# Patient Record
Sex: Female | Born: 1945
Health system: Southern US, Community
[De-identification: ages and names within clinical notes are randomized; demographics above are authoritative.]

## PROBLEM LIST (undated history)

## (undated) DIAGNOSIS — D123 Benign neoplasm of transverse colon: Secondary | ICD-10-CM

## (undated) DIAGNOSIS — M25569 Pain in unspecified knee: Secondary | ICD-10-CM

## (undated) DIAGNOSIS — E785 Hyperlipidemia, unspecified: Secondary | ICD-10-CM

## (undated) DIAGNOSIS — R0600 Dyspnea, unspecified: Secondary | ICD-10-CM

## (undated) DIAGNOSIS — D122 Benign neoplasm of ascending colon: Secondary | ICD-10-CM

## (undated) DIAGNOSIS — Z860100 Personal history of colon polyps, unspecified: Secondary | ICD-10-CM

## (undated) DIAGNOSIS — R5383 Other fatigue: Secondary | ICD-10-CM

## (undated) DIAGNOSIS — Z9889 Other specified postprocedural states: Secondary | ICD-10-CM

## (undated) DIAGNOSIS — E669 Obesity, unspecified: Secondary | ICD-10-CM

## (undated) DIAGNOSIS — D124 Benign neoplasm of descending colon: Secondary | ICD-10-CM

## (undated) DIAGNOSIS — T753XXA Motion sickness, initial encounter: Secondary | ICD-10-CM

## (undated) DIAGNOSIS — I10 Essential (primary) hypertension: Secondary | ICD-10-CM

## (undated) DIAGNOSIS — R112 Nausea with vomiting, unspecified: Secondary | ICD-10-CM

## (undated) HISTORY — DX: Benign neoplasm of descending colon: D12.4

## (undated) HISTORY — DX: Benign neoplasm of ascending colon: D12.2

## (undated) HISTORY — DX: Hyperlipidemia, unspecified: E78.5

## (undated) HISTORY — PX: COLONOSCOPY: SHX174

## (undated) HISTORY — DX: Personal history of colonic polyps: Z86.010

## (undated) HISTORY — DX: Obesity, unspecified: E66.9

## (undated) HISTORY — DX: Benign neoplasm of transverse colon: D12.3

## (undated) HISTORY — DX: Personal history of colon polyps, unspecified: Z86.0100

## (undated) HISTORY — DX: Pain in unspecified knee: M25.569

## (undated) HISTORY — DX: Essential (primary) hypertension: I10

---

## 1976-04-18 HISTORY — PX: ABDOMINAL HYSTERECTOMY: SHX81

## 2003-11-11 ENCOUNTER — Other Ambulatory Visit: Payer: Self-pay

## 2005-07-21 ENCOUNTER — Ambulatory Visit: Payer: Self-pay | Admitting: Unknown Physician Specialty

## 2006-04-18 DIAGNOSIS — Z86010 Personal history of colonic polyps: Secondary | ICD-10-CM

## 2006-04-18 DIAGNOSIS — Z860101 Personal history of adenomatous and serrated colon polyps: Secondary | ICD-10-CM

## 2006-04-18 HISTORY — DX: Personal history of adenomatous and serrated colon polyps: Z86.0101

## 2006-04-18 HISTORY — DX: Personal history of colonic polyps: Z86.010

## 2006-08-14 ENCOUNTER — Ambulatory Visit: Payer: Self-pay | Admitting: Unknown Physician Specialty

## 2006-10-06 ENCOUNTER — Ambulatory Visit: Payer: Self-pay | Admitting: General Surgery

## 2007-04-19 LAB — HM PAP SMEAR

## 2007-08-15 ENCOUNTER — Ambulatory Visit: Payer: Self-pay | Admitting: Unknown Physician Specialty

## 2008-08-29 ENCOUNTER — Ambulatory Visit: Payer: Self-pay | Admitting: Internal Medicine

## 2008-09-01 ENCOUNTER — Ambulatory Visit: Payer: Self-pay | Admitting: Unknown Physician Specialty

## 2009-11-13 ENCOUNTER — Ambulatory Visit: Payer: Self-pay | Admitting: Unknown Physician Specialty

## 2010-11-01 ENCOUNTER — Ambulatory Visit: Payer: Self-pay | Admitting: Internal Medicine

## 2010-11-17 HISTORY — PX: REPLACEMENT TOTAL KNEE: SUR1224

## 2010-11-25 ENCOUNTER — Ambulatory Visit: Payer: Self-pay | Admitting: Unknown Physician Specialty

## 2010-11-29 ENCOUNTER — Ambulatory Visit: Payer: Self-pay | Admitting: General Practice

## 2010-12-15 ENCOUNTER — Inpatient Hospital Stay: Payer: Self-pay | Admitting: General Practice

## 2011-03-14 ENCOUNTER — Other Ambulatory Visit: Payer: Self-pay | Admitting: Internal Medicine

## 2011-03-14 NOTE — Telephone Encounter (Signed)
WORK [phone] PT CALLED SHE HAS QUESTION ABOUT KNEE SURGERY.  SHE WENT TO DENTIST TODAY AND THEY WOULD NOT SEE BECAUSE IT HAS NOT BEEN 6 MONTH SINCE KNEE REPLACEMENT IN AUG.  SHE HAS ANTIBOTIC TO TAKE PRIOR TO APPOINTMENT  THEY TOLD HER TO KEEP ANTIBOTICS ON HAND INCASE SHE GOT CUT  THE INFECTION WOULD GO STRAIGHT TO KNEE.  DOES SHE NEED TO KEEP ANTIBOTICS ON HAND SHE GOING TO CALL PHARMACY TO GET HER BP MEDS REFILLED  LOSARTAN 100MG   1 DAILY.  SHE WOULD LIKE A 90 DAY SUPPLY  [facility] IN [place] SHE HAS APPOINTMENT 04/14/11

## 2011-03-14 NOTE — Telephone Encounter (Signed)
Losartan with 6 refills authorized

## 2011-03-15 MED ORDER — LOSARTAN POTASSIUM 100 MG PO TABS: 100.0000 mg | ORAL_TABLET | Freq: Every day | ORAL | Status: DC

## 2011-03-15 NOTE — Telephone Encounter (Signed)
Patient says that her dentist told her that since having her knee surgery she was told to keep an antibiotic on hand at all times. She is asking if she should. If so can she get something called in. Also wants refill on her losartan.

## 2011-03-15 NOTE — Telephone Encounter (Signed)
That depneds on her orthopedist.  Some of them are doing that. That really is the role of her orthopedist to supply her the prescription, but if she has had trouble reaching him we can do it .  Amoxicillin  500 mg 4 tablets on hour prior to procedure.  #4   1 refill.   the e losartan should have been called in by [place] already.

## 2011-03-16 MED ORDER — LOSARTAN POTASSIUM 100 MG PO TABS: 100.0000 mg | ORAL_TABLET | Freq: Every day | ORAL | Status: DC

## 2011-03-16 NOTE — Telephone Encounter (Signed)
Rx for losartan has been called in, I have left a message asking patient to return my call about the antibiotic.

## 2011-03-17 NOTE — Telephone Encounter (Signed)
This whole phone note was unnecessary.   Patient stated the dentist gave her an antibiotic that day, she just called and talked to Zella Ball to make an appt to discuss everything with Dr. Darrick Huntsman since it had been about 6 months since she had a follow up.  Patient has an appt.

## 2011-04-05 ENCOUNTER — Ambulatory Visit: Payer: Self-pay | Admitting: Internal Medicine

## 2011-04-13 ENCOUNTER — Encounter: Payer: Self-pay | Admitting: Internal Medicine

## 2011-04-14 ENCOUNTER — Encounter: Payer: Self-pay | Admitting: Internal Medicine

## 2011-04-14 ENCOUNTER — Ambulatory Visit (INDEPENDENT_AMBULATORY_CARE_PROVIDER_SITE_OTHER): Payer: BC Managed Care – PPO | Admitting: Internal Medicine

## 2011-04-14 DIAGNOSIS — F419 Anxiety disorder, unspecified: Secondary | ICD-10-CM

## 2011-04-14 DIAGNOSIS — R05 Cough: Secondary | ICD-10-CM

## 2011-04-14 DIAGNOSIS — E785 Hyperlipidemia, unspecified: Secondary | ICD-10-CM

## 2011-04-14 DIAGNOSIS — M545 Low back pain, unspecified: Secondary | ICD-10-CM

## 2011-04-14 DIAGNOSIS — R059 Cough, unspecified: Secondary | ICD-10-CM

## 2011-04-14 DIAGNOSIS — M549 Dorsalgia, unspecified: Secondary | ICD-10-CM

## 2011-04-14 DIAGNOSIS — E663 Overweight: Secondary | ICD-10-CM | POA: Insufficient documentation

## 2011-04-14 DIAGNOSIS — F411 Generalized anxiety disorder: Secondary | ICD-10-CM

## 2011-04-14 DIAGNOSIS — I10 Essential (primary) hypertension: Secondary | ICD-10-CM | POA: Insufficient documentation

## 2011-04-14 DIAGNOSIS — E669 Obesity, unspecified: Secondary | ICD-10-CM

## 2011-04-14 LAB — COMPREHENSIVE METABOLIC PANEL
ALT: 23 U/L (ref 0–35)
Alkaline Phosphatase: 67 U/L (ref 39–117)
Glucose, Bld: 89 mg/dL (ref 70–99)
Sodium: 141 mEq/L (ref 135–145)
Total Bilirubin: 0.5 mg/dL (ref 0.3–1.2)
Total Protein: 6.5 g/dL (ref 6.0–8.3)

## 2011-04-14 LAB — LIPID PANEL
HDL: 43.6 mg/dL (ref >39.00)
Total CHOL/HDL Ratio: 6
Triglycerides: 378 mg/dL — ABNORMAL HIGH (ref 0.0–149.0)

## 2011-04-14 LAB — TSH: TSH: 1.54 u[IU]/mL (ref 0.35–5.50)

## 2011-04-14 MED ORDER — DOXYCYCLINE HYCLATE 100 MG PO TABS: 100.0000 mg | ORAL_TABLET | Freq: Two times a day (BID) | ORAL | Status: AC

## 2011-04-14 MED ORDER — PAROXETINE HCL 10 MG PO TABS: 10.0000 mg | ORAL_TABLET | Freq: Every day | ORAL | Status: DC

## 2011-04-14 MED ORDER — LOSARTAN POTASSIUM 100 MG PO TABS: 100.0000 mg | ORAL_TABLET | Freq: Every day | ORAL | Status: DC

## 2011-04-14 MED ORDER — BENZONATATE 200 MG PO CAPS: 200.0000 mg | ORAL_CAPSULE | Freq: Three times a day (TID) | ORAL | Status: AC | PRN

## 2011-04-14 MED ORDER — TRAMADOL HCL 50 MG PO TABS: 50.0000 mg | ORAL_TABLET | Freq: Four times a day (QID) | ORAL | Status: AC | PRN

## 2011-04-14 MED ORDER — FLUTICASONE PROPIONATE 50 MCG/ACT NA SUSP: 2.0000 | Freq: Every day | NASAL | Status: DC

## 2011-04-14 NOTE — Assessment & Plan Note (Signed)
With prior intolerance to statin therapy  Repeat lipids due.

## 2011-04-14 NOTE — Progress Notes (Signed)
Subjective:    Patient ID: Deeksha Cotrell, female    DOB: 24-Mar-1946, 65 y.o.   MRN: [id number]  HPI  Ms. Dieu is a very pleasant 65 yo white female with a history of obesity, hypertension and hyperlipidemia who presents for  6 month followup.  She has had difficulty sustaining the 60 lbs weight loss that she achieved in 2010/early 2011  by exercising and following a carbohydrate restricted diet.  Her weight gain began in Oct 2011 when her persistent right knee pain prevented her from exercising regularly, and was compliacted by a fall in May 2012 which caused sustained low back pain.  She underwentotal  right knee replacement in  August 29 by Dr. Milinda Antis.  She has good ROM and continuees to do the home exercises advised by PT. She continues to suffer from low back pain which is aggravated by walking and standing more than 30 minutes .  No radiculopathy.Still using daily meloxicam without GI upset, but she has noted elevated blood pressures lately despite compliance with her  medications.   Past Medical History  Diagnosis Date  . Obesity   . Hypertension   . Hyperlipidemia     with statin intolerance  . Knee pain     Current Outpatient Prescriptions on File Prior to Visit  Medication Sig Dispense Refill  . meloxicam (MOBIC) 15 MG tablet Take 15 mg by mouth daily.           Review of Systems  Constitutional: Positive for unexpected weight change. Negative for fever and chills.  HENT: Negative for hearing loss, ear pain, nosebleeds, congestion, sore throat, facial swelling, rhinorrhea, sneezing, mouth sores, trouble swallowing, neck pain, neck stiffness, voice change, postnasal drip, sinus pressure, tinnitus and ear discharge.   Eyes: Negative for pain, discharge, redness and visual disturbance.  Respiratory: Negative for cough, chest tightness, shortness of breath, wheezing and stridor.   Cardiovascular: Negative for chest pain, palpitations and leg swelling.  Musculoskeletal: Positive for  back pain. Negative for myalgias and arthralgias.  Skin: Negative for color change and rash.  Neurological: Negative for dizziness, weakness, light-headedness and headaches.  Hematological: Negative for adenopathy.   BP 136/84  Pulse 72  Temp(Src) 98.3 F (36.8 C) (Oral)  Ht 5' 3.5" (1.613 m)  Wt 213 lb 8 oz (96.843 kg)  BMI 37.23 kg/m2     Objective:   Physical Exam  Vitals reviewed. Constitutional: She is oriented to person, place, and time. She appears well-developed and well-nourished.  HENT:  Mouth/Throat: Oropharynx is clear and moist.  Eyes: EOM are normal. Pupils are equal, round, and reactive to light. No scleral icterus.  Neck: Normal range of motion. Neck supple. No JVD present. No thyromegaly present.  Cardiovascular: Normal rate, regular rhythm, normal heart sounds and intact distal pulses.   Pulmonary/Chest: Effort normal and breath sounds normal.  Abdominal: Soft. Bowel sounds are normal. She exhibits no mass. There is no tenderness.  Musculoskeletal: Normal range of motion. She exhibits no edema.  Lymphadenopathy:    She has no cervical adenopathy.  Neurological: She is alert and oriented to person, place, and time.  Skin: Skin is warm and dry.  Psychiatric: She has a normal mood and affect.      Assessment & Plan:   Obesity (BMI 30-39.9) Given her regain of nearly 40 lbs, will check thyroid function as this has not been done in one year .  She is not dieting or exercising due to low back pain and the holidays.  Encouragement given.  Her personal best was 182 lbs in Oct 2011.   Lumbago without sciatica With no radicular symptoms, there is no indication for MRI.  Recommend that she discuss back strengthening exercises with her chiropractor. Stopping meloxicam and adding tramadol for pain control.   Hyperlipidemia LDL goal < 100 With prior intolerance to statin therapy  Repeat lipids due.     Updated Medication List Outpatient Encounter Prescriptions as of  04/14/2011  Medication Sig Dispense Refill  . fluticasone (FLONASE) 50 MCG/ACT nasal spray Place 2 sprays into the nose daily.  32 g  6  . losartan (COZAAR) 100 MG tablet Take 1 tablet (100 mg total) by mouth daily.  60 tablet  6  . meloxicam (MOBIC) 15 MG tablet Take 15 mg by mouth daily.        Marland Kitchen PARoxetine (PAXIL) 10 MG tablet Take 1 tablet (10 mg total) by mouth daily.  60 tablet  6  . DISCONTD: fluticasone (FLONASE) 50 MCG/ACT nasal spray Place 2 sprays into the nose daily.        Marland Kitchen DISCONTD: losartan (COZAAR) 100 MG tablet Take 1 tablet (100 mg total) by mouth daily.  90 tablet  3  . DISCONTD: PARoxetine (PAXIL) 10 MG tablet Take 10 mg by mouth daily.        . benzonatate (TESSALON) 200 MG capsule Take 1 capsule (200 mg total) by mouth 3 (three) times daily as needed for cough.  30 capsule  1  . doxycycline (VIBRA-TABS) 100 MG tablet Take 1 tablet (100 mg total) by mouth 2 (two) times daily.  14 tablet  0  . traMADol (ULTRAM) 50 MG tablet Take 1 tablet (50 mg total) by mouth every 6 (six) hours as needed for pain. Maximum dose= 8 tablets per day  120 tablet  3

## 2011-04-14 NOTE — Assessment & Plan Note (Signed)
With no radicular symptoms, there is no indication for MRI.  Recommend that she discuss back strengthening exercises with her chiropractor. Stopping meloxicam and adding tramadol for pain control.

## 2011-04-14 NOTE — Assessment & Plan Note (Signed)
Given her regain of nearly 40 lbs, will check thyroid function as this has not been done in one year .  She is not dieting or exercising due to low back pain and the holidays. Encouragement given.  Her personal best was 182 lbs in Oct 2011.

## 2011-04-14 NOTE — Patient Instructions (Addendum)
Suspend meloxicam for 2 weeks to see if your blood pressure improves and to see if the meloxicam is still helping.  Use Tramadol up to 8 daily maximum dose.    I called in doxycucline and a cough tablet for the next 7 days

## 2011-05-02 ENCOUNTER — Telehealth: Payer: Self-pay | Admitting: Internal Medicine

## 2011-05-02 MED ORDER — BENZONATATE 200 MG PO CAPS: 200.0000 mg | ORAL_CAPSULE | Freq: Three times a day (TID) | ORAL | Status: AC | PRN

## 2011-05-02 NOTE — Telephone Encounter (Signed)
Patient notified. Rx sent to pharmacy. Patient will start zyrtec or allegra and call back if no improvement.

## 2011-05-02 NOTE — Telephone Encounter (Signed)
Based on symptoms does not need an antibiotic.  But can refill tessalon  Rx.    Ask her to start zyrtec or allegra to see if an antihistamin will help., if not, make appt

## 2011-05-02 NOTE — Telephone Encounter (Signed)
Pt was seen for URI symptoms on 12/27.  She was given doxycycline which helped but when she finished the medicine her symptoms came back- congestion, clear to yellow nasal drainage, cough.  No fever.  She thinks she needs more antibiotic.  She will start back on the tesselon, but may need a refill because she only has about 14. Uses [facility].

## 2011-05-02 NOTE — Telephone Encounter (Signed)
The medication that was given at her last appointment is not doing any good and the patient would like something else called into the pharmacy. She is still coughing and hacking .

## 2011-09-14 ENCOUNTER — Encounter: Payer: Self-pay | Admitting: Internal Medicine

## 2011-09-14 DIAGNOSIS — Z86010 Personal history of colonic polyps: Secondary | ICD-10-CM | POA: Insufficient documentation

## 2011-09-21 ENCOUNTER — Ambulatory Visit: Payer: Self-pay | Admitting: General Surgery

## 2011-09-22 LAB — PATHOLOGY REPORT

## 2011-10-06 LAB — HM COLONOSCOPY

## 2011-11-15 LAB — HM MAMMOGRAPHY: HM Mammogram: NORMAL

## 2011-11-15 LAB — HM PAP SMEAR: HM Pap smear: NORMAL

## 2011-12-02 ENCOUNTER — Ambulatory Visit: Payer: BC Managed Care – PPO | Admitting: Internal Medicine

## 2011-12-02 ENCOUNTER — Ambulatory Visit: Payer: Self-pay | Admitting: Unknown Physician Specialty

## 2011-12-16 ENCOUNTER — Ambulatory Visit (INDEPENDENT_AMBULATORY_CARE_PROVIDER_SITE_OTHER): Payer: BC Managed Care – PPO | Admitting: Internal Medicine

## 2011-12-16 ENCOUNTER — Encounter: Payer: Self-pay | Admitting: Internal Medicine

## 2011-12-16 VITALS — BP 140/86 | HR 60 | Temp 98.4°F | Resp 16 | Wt 224.2 lb

## 2011-12-16 DIAGNOSIS — R05 Cough: Secondary | ICD-10-CM

## 2011-12-16 DIAGNOSIS — Z124 Encounter for screening for malignant neoplasm of cervix: Secondary | ICD-10-CM

## 2011-12-16 DIAGNOSIS — Z86010 Personal history of colonic polyps: Secondary | ICD-10-CM

## 2011-12-16 DIAGNOSIS — E669 Obesity, unspecified: Secondary | ICD-10-CM

## 2011-12-16 DIAGNOSIS — F411 Generalized anxiety disorder: Secondary | ICD-10-CM

## 2011-12-16 DIAGNOSIS — Z1239 Encounter for other screening for malignant neoplasm of breast: Secondary | ICD-10-CM

## 2011-12-16 DIAGNOSIS — F419 Anxiety disorder, unspecified: Secondary | ICD-10-CM

## 2011-12-16 DIAGNOSIS — I10 Essential (primary) hypertension: Secondary | ICD-10-CM

## 2011-12-16 DIAGNOSIS — E785 Hyperlipidemia, unspecified: Secondary | ICD-10-CM

## 2011-12-16 LAB — COMPREHENSIVE METABOLIC PANEL
AST: 29 U/L (ref 0–37)
Albumin: 3.9 g/dL (ref 3.5–5.2)
Alkaline Phosphatase: 59 U/L (ref 39–117)
Potassium: 4.5 mEq/L (ref 3.5–5.1)
Sodium: 140 mEq/L (ref 135–145)
Total Bilirubin: 0.5 mg/dL (ref 0.3–1.2)
Total Protein: 6.9 g/dL (ref 6.0–8.3)

## 2011-12-16 LAB — LIPID PANEL
Cholesterol: 220 mg/dL — ABNORMAL HIGH (ref 0–200)
Total CHOL/HDL Ratio: 5

## 2011-12-16 LAB — TSH: TSH: 1.72 u[IU]/mL (ref 0.35–5.50)

## 2011-12-16 LAB — LDL CHOLESTEROL, DIRECT: Direct LDL: 101.3 mg/dL

## 2011-12-16 MED ORDER — PAROXETINE HCL 10 MG PO TABS: 10.0000 mg | ORAL_TABLET | Freq: Every day | ORAL | Status: DC

## 2011-12-16 MED ORDER — FLUTICASONE PROPIONATE 50 MCG/ACT NA SUSP: 2.0000 | Freq: Every day | NASAL | Status: DC

## 2011-12-16 MED ORDER — LOSARTAN POTASSIUM 100 MG PO TABS: 100.0000 mg | ORAL_TABLET | Freq: Every day | ORAL | Status: DC

## 2011-12-16 NOTE — Progress Notes (Signed)
Patient ID: Joanne Benton, female   DOB: 24-Apr-1945, 66 y.o.   MRN: [id number]  Patient Active Problem List  Diagnosis  . Obesity (BMI 30-39.9)  . Lumbago without sciatica  . Hypertension  . Hyperlipidemia LDL goal < 100  . Hx of adenomatous colonic polyps    Subjective:  CC:   Chief Complaint  Patient presents with  . Medication Refill    HPI:   Joanne Riceis a 66 y.o. female who presents for follow up on hyperlipidemia, obesity and knee pain   History of right knee pain ,  Both hips hurting.  Wondering if a new fish oil she has heard about would help.  She has had a prior trial of osteo bi flex with no significant improvement in her pain. She is also been voiding meloxican for the past 3 weeks due to some mild gastritis symptoms. She has not been following a low glycemic index diet in several months and has gained all her weight back. She is currently in an apathetic state state about her weight.   Past Medical History  Diagnosis Date  . Obesity   . Hypertension   . Hyperlipidemia     with statin intolerance  . Knee pain   . Hx of adenomatous colonic polyps 2008    Sankar, repeat due 2013    Past Surgical History  Procedure Date  . Replacement total knee August 2012    Right , [place]         The following portions of the patient's history were reviewed and updated as appropriate: Allergies, current medications, and problem list.    Review of Systems:   12 Pt  review of systems was negative except those addressed in the HPI,     History   Social History  . Marital Status: Married    Spouse Name: N/A    Number of Children: N/A  . Years of Education: N/A   Occupational History  . Not on file.   Social History Main Topics  . Smoking status: Never Smoker   . Smokeless tobacco: Never Used  . Alcohol Use: No  . Drug Use: No  . Sexually Active: Not on file   Other Topics Concern  . Not on file   Social History Narrative   Married.     Objective:  BP 140/86  Pulse 60  Temp 98.4 F (36.9 C) (Oral)  Resp 16  Wt 224 lb 4 oz (101.719 kg)  SpO2 97%  General appearance: alert, cooperative and appears stated age Ears: normal TM's and external ear canals both ears Throat: lips, mucosa, and tongue normal; teeth and gums normal Neck: no adenopathy, no carotid bruit, supple, symmetrical, trachea midline and thyroid not enlarged, symmetric, no tenderness/mass/nodules Back: symmetric, no curvature. ROM normal. No CVA tenderness. Lungs: clear to auscultation bilaterally Heart: regular rate and rhythm, S1, S2 normal, no murmur, click, rub or gallop Abdomen: soft, non-tender; bowel sounds normal; no masses,  no organomegaly Pulses: 2+ and symmetric Skin: Skin color, texture, turgor normal. No rashes or lesions Lymph nodes: Cervical, supraclavicular, and axillary nodes normal.  Assessment and Plan:  Hx of adenomatous colonic polyps She  Her scheduled colonoscopy by Dr. Doristine Counter for history of adenomatous polyps. An adenomatous polyp was again found in the sigmoid colon. Repeat Colonoscopy is due in 2018.  Hyperlipidemia LDL goal < 100 Is triglycerides 400. Recommend trial of TriCor in a low glycemic index diet which she did very well and in previous years to  manage her obesity  Obesity (BMI 30-39.9) BMI is 39. We addressed her weight today is a significant contributor joint pain. She has had prior success with a low glycemic index diet and has concurrent hypertriglyceridemia. She was given a printout an example diet.  Hypertension Well controlled previously on losartan alone. She has discontinued use of meloxicam to see if this is a major difference. It has not. Will give her a couple of months with weight loss see for her diastolic come down. Otherwise we'll add hydrochlorothiazide to her Lorcet losartan.   Updated Medication List Outpatient Encounter Prescriptions as of 12/16/2011  Medication Sig Dispense Refill  .  fluticasone (FLONASE) 50 MCG/ACT nasal spray Place 2 sprays into the nose daily.  32 g  6  . losartan (COZAAR) 100 MG tablet Take 1 tablet (100 mg total) by mouth daily.  60 tablet  6  . PARoxetine (PAXIL) 10 MG tablet Take 1 tablet (10 mg total) by mouth daily.  60 tablet  6  . DISCONTD: fluticasone (FLONASE) 50 MCG/ACT nasal spray Place 2 sprays into the nose daily.  32 g  6  . DISCONTD: losartan (COZAAR) 100 MG tablet Take 1 tablet (100 mg total) by mouth daily.  60 tablet  6  . DISCONTD: PARoxetine (PAXIL) 10 MG tablet Take 1 tablet (10 mg total) by mouth daily.  60 tablet  6  . DISCONTD: meloxicam (MOBIC) 15 MG tablet Take 15 mg by mouth daily.

## 2011-12-16 NOTE — Patient Instructions (Addendum)
We  Don't care about H Pylori unless you develop problems with your stomach  Ok to combine tylenol, advil, and tramadol as needed   This is  My updated version of a  "Low GI"  Diet:  All of the foods can be found at grocery stores and in bulk at [employer].  The Atkins protein bars and shakes are available in more varieties at [employer], [employer] and [employer].     7 AM Breakfast:  Low carbohydrate Protein  Shakes (I recommend the EAS AdvantEdge "Carb Control" shakes  Or the low carb shakes by Atkins.   Both are available everywhere:  In  cases at [employer]Js  Or in 4 packs at grocery stores and pharmacies  2.5 carbs  (Alternative is  a toasted Arnold's Sandwhich Thin w/ peanut butter, a "Bagel Thin" with cream cheese and salmon) or  a scrambled egg burrito made with a low carb tortilla .  Avoid cereal and bananas, oatmeal too unless you are cooking the old fashioned kind that takes 30-40 minutes to prepare.  the rest is overly processed, has minimal fiber, and is loaded with carbohydrates!   10 AM: Protein bar by Atkins (the snack size, under 200 cal).  There are many varieties , available widely again or in bulk in limited varieties at [employer]Js)  Other so called "protein bars" tend to be loaded with carbohydrates.  Remember, in food advertising, the word "energy" is synonymous for " carbohydrate."  Lunch: sandwich of [place], (or any lunchmeat, grilled meat or canned tuna), fresh avocado, mayonnaise  and cheese on a lower carbohydrate pita bread, flatbread, or tortilla . Ok to use regular mayonnaise. The bread is the only source or carbohydrate that can be decreased (Joseph's makes a pita bread and a flat bread that are 50 cal and 4 net carbs ; Toufayan makes a low carb flatbread that's 100 cal and 9 net carbs  and  Mission makes a low carb whole wheat tortilla  That is 210 cal and 6 net carbs)  3 PM:  Mid day :  Another protein bar,  Or a  cheese stick (100 cal, 0 carbs),  Or 1 ounce of  almonds, walnuts,  pistachios, pecans, peanuts,  Macadamia nuts. Or a Dannon light n Fit greek yogurt, 80 cal 8 net carbs . Avoid "granola"; the dried cranberries and raisins are loaded with carbohydrates. Mixed nuts ok if no raisins or cranberries or dried fruit.      6 PM  Dinner:  "mean and green:"  Meat/chicken/fish or a high protein legume; , with a green salad, and a low GI  Veggie (broccoli, cauliflower, green beans, spinach, brussel sprouts. Lima beans) : Avoid "Low fat dressings, as well as Reyne Dumas and [address]! They are loaded with sugar! Instead use ranch, vinagrette,  Blue cheese, etc  9 PM snack : Breyer's "low carb" fudgsicle or  ice cream bar (Carb Smart line), or  Weight Watcher's ice cream bar , or another "no sugar added" ice cream;a serving of fresh berries/cherries with whipped cream (Avoid bananas, pineapple, grapes  and watermelon on a regular basis because they are high in sugar)   Remember that snack Substitutions should be less than 15 to 20 carbs  Per serving. Remember to subtract fiber grams and sugar alcohols to get the "net carbs."

## 2011-12-17 ENCOUNTER — Encounter: Payer: Self-pay | Admitting: Internal Medicine

## 2011-12-17 ENCOUNTER — Telehealth: Payer: Self-pay | Admitting: Internal Medicine

## 2011-12-17 MED ORDER — FENOFIBRATE 145 MG PO TABS: 145.0000 mg | ORAL_TABLET | Freq: Every day | ORAL | Status: DC

## 2011-12-17 NOTE — Assessment & Plan Note (Signed)
Is triglycerides 400. Recommend trial of TriCor in a low glycemic index diet which she did very well and in previous years to manage her obesity

## 2011-12-17 NOTE — Telephone Encounter (Signed)
Her cholesterol was pretty high without medication. Her triglycerides were over 400. I recommended she start TriCor 1 tablet daily to get her triglycerides down. The low glycemic index will help as well but I would like to start it least with TriCor until she is motivated to do the diet. She will need repeat fasting lipids and a cemented in 3 months. I have sent a prescription to her pharmacy.

## 2011-12-17 NOTE — Assessment & Plan Note (Signed)
BMI is 39. We addressed her weight today is a significant contributor joint pain. She has had prior success with a low glycemic index diet and has concurrent hypertriglyceridemia. She was given a printout an example diet.

## 2011-12-17 NOTE — Assessment & Plan Note (Addendum)
She  Her scheduled colonoscopy by Dr. Doristine Counter for history of adenomatous polyps. An adenomatous polyp was again found in the sigmoid colon. Repeat Colonoscopy is due in 2018.

## 2011-12-17 NOTE — Assessment & Plan Note (Signed)
Well controlled previously on losartan alone. She has discontinued use of meloxicam to see if this is a major difference. It has not. Will give her a couple of months with weight loss see for her diastolic come down. Otherwise we'll add hydrochlorothiazide to her Lorcet losartan.

## 2011-12-20 NOTE — Telephone Encounter (Signed)
Patient notified

## 2011-12-20 NOTE — Telephone Encounter (Signed)
Left message asking patient to call.

## 2011-12-20 NOTE — Telephone Encounter (Signed)
Copy of low glycemic index diet mailed to patient, also she will call back to schedule lab appt

## 2012-03-26 ENCOUNTER — Ambulatory Visit (INDEPENDENT_AMBULATORY_CARE_PROVIDER_SITE_OTHER): Payer: BC Managed Care – PPO | Admitting: Internal Medicine

## 2012-03-26 ENCOUNTER — Encounter: Payer: Self-pay | Admitting: Internal Medicine

## 2012-03-26 ENCOUNTER — Ambulatory Visit: Payer: BC Managed Care – PPO

## 2012-03-26 VITALS — BP 124/82 | HR 50 | Temp 97.4°F | Resp 12 | Ht 63.0 in | Wt 202.2 lb

## 2012-03-26 DIAGNOSIS — E785 Hyperlipidemia, unspecified: Secondary | ICD-10-CM

## 2012-03-26 DIAGNOSIS — I498 Other specified cardiac arrhythmias: Secondary | ICD-10-CM

## 2012-03-26 DIAGNOSIS — I10 Essential (primary) hypertension: Secondary | ICD-10-CM

## 2012-03-26 DIAGNOSIS — E669 Obesity, unspecified: Secondary | ICD-10-CM

## 2012-03-26 DIAGNOSIS — K5900 Constipation, unspecified: Secondary | ICD-10-CM

## 2012-03-26 DIAGNOSIS — Z1331 Encounter for screening for depression: Secondary | ICD-10-CM

## 2012-03-26 DIAGNOSIS — R001 Bradycardia, unspecified: Secondary | ICD-10-CM

## 2012-03-26 LAB — LDL CHOLESTEROL, DIRECT: Direct LDL: 142.9 mg/dL

## 2012-03-26 LAB — COMPREHENSIVE METABOLIC PANEL
AST: 21 U/L (ref 0–37)
Alkaline Phosphatase: 60 U/L (ref 39–117)
BUN: 13 mg/dL (ref 6–23)
Creatinine, Ser: 0.8 mg/dL (ref 0.4–1.2)
Total Bilirubin: 0.6 mg/dL (ref 0.3–1.2)

## 2012-03-26 LAB — LIPID PANEL
Cholesterol: 235 mg/dL — ABNORMAL HIGH (ref 0–200)
HDL: 36.1 mg/dL — ABNORMAL LOW (ref >39.00)
Triglycerides: 323 mg/dL — ABNORMAL HIGH (ref 0.0–149.0)
VLDL: 64.6 mg/dL — ABNORMAL HIGH (ref 0.0–40.0)

## 2012-03-26 NOTE — Assessment & Plan Note (Signed)
LDL remains elevated at 142 but triglycerides dropped from 455 to 320.  Will check again in 3 months

## 2012-03-26 NOTE — Progress Notes (Signed)
Patient ID: Joanne Benton, female   DOB: June 23, 1945, 66 y.o.   MRN: [id number]  Patient Active Problem List  Diagnosis  . Obesity (BMI 30-39.9)  . Lumbago without sciatica  . Hypertension  . Hyperlipidemia LDL goal < 100  . Hx of adenomatous colonic polyps  . Bradycardia    Subjective:  CC:   Chief Complaint  Patient presents with  . Follow-up    HPI:   Joanne Riceis a 66 y.o. female who presents for follow up on hyperlipidemia , hypertension, and obesity.  She feels great.  She never started the fenofibrate, but decided to address it with weight loss,  She has been exercising with a Systems analyst. She has lost 22 lbs thus far,  But is constipated despite using fiber supplement daily. Following a low gi diet. No aches or pains.    Past Medical History  Diagnosis Date  . Obesity   . Hypertension   . Hyperlipidemia     with statin intolerance  . Knee pain   . Hx of adenomatous colonic polyps 2008    Sankar, repeat due 2013    Past Surgical History  Procedure Date  . Replacement total knee August 2012    Right , [place]         The following portions of the patient's history were reviewed and updated as appropriate: Allergies, current medications, and problem list.    Review of Systems:   Patient denies headache, fevers, malaise, unintentional weight loss, skin rash, eye pain, sinus congestion and sinus pain, sore throat, dysphagia,  hemoptysis , cough, dyspnea, wheezing, chest pain, palpitations, orthopnea, edema, abdominal pain, nausea, melena, diarrhea, constipation, flank pain, dysuria, hematuria, urinary  Frequency, nocturia, numbness, tingling, seizures,  Focal weakness, Loss of consciousness,  Tremor, insomnia, depression, anxiety, and suicidal ideation.         History   Social History  . Marital Status: Married    Spouse Name: N/A    Number of Children: N/A  . Years of Education: N/A   Occupational History  . Not on file.   Social History Main  Topics  . Smoking status: Never Smoker   . Smokeless tobacco: Never Used  . Alcohol Use: No  . Drug Use: No  . Sexually Active: Not on file   Other Topics Concern  . Not on file   Social History Narrative   Married.    Objective:  BP 124/82  Pulse 50  Temp 97.4 F (36.3 C) (Oral)  Resp 12  Ht 5\' 3"  (1.6 m)  Wt 202 lb 4 oz (91.74 kg)  BMI 35.83 kg/m2  SpO2 95%  General appearance: alert, cooperative and appears stated age Ears: normal TM's and external ear canals both ears Throat: lips, mucosa, and tongue normal; teeth and gums normal Neck: no adenopathy, no carotid bruit, supple, symmetrical, trachea midline and thyroid not enlarged, symmetric, no tenderness/mass/nodules Back: symmetric, no curvature. ROM normal. No CVA tenderness. Lungs: clear to auscultation bilaterally Heart: regular rate and rhythm, S1, S2 normal, no murmur, click, rub or gallop Abdomen: soft, non-tender; bowel sounds normal; no masses,  no organomegaly Pulses: 2+ and symmetric Skin: Skin color, texture, turgor normal. No rashes or lesions Lymph nodes: Cervical, supraclavicular, and axillary nodes normal.  Assessment and Plan:  Bradycardia Asymptomatic, whi no history of syncope.  An ekg was done today, QT interval was borderline.  Checking electrolytes.   Hypertension Now well controlled with weight loss and suspension of meloxicam.  No changes today  Hyperlipidemia LDL goal < 100 LDL remains elevated at 142 but triglycerides dropped from 455 to 320.  Will check again in 3 months  Obesity (BMI 30-39.9) Improving BMI with regular exercise program and low GI diet.    Updated Medication List Outpatient Encounter Prescriptions as of 03/26/2012  Medication Sig Dispense Refill  . fluticasone (FLONASE) 50 MCG/ACT nasal spray Place 2 sprays into the nose daily.  32 g  6  . losartan (COZAAR) 100 MG tablet Take 1 tablet (100 mg total) by mouth daily.  60 tablet  6  . PARoxetine (PAXIL) 10 MG  tablet Take 1 tablet (10 mg total) by mouth daily.  60 tablet  6  . [DISCONTINUED] fenofibrate (TRICOR) 145 MG tablet Take 1 tablet (145 mg total) by mouth daily.  30 tablet  5     Orders Placed This Encounter  Procedures  . Lipid panel  . Comprehensive metabolic panel  . Magnesium  . LDL cholesterol, direct  . EKG 12-Lead    Return in about 3 months (around 06/24/2012).

## 2012-03-26 NOTE — Assessment & Plan Note (Addendum)
Now well controlled with weight loss and suspension of meloxicam.  No changes today

## 2012-03-26 NOTE — Assessment & Plan Note (Addendum)
Asymptomatic, whi no history of syncope.  An ekg was done today, QT interval was borderline.  Checking electrolytes.

## 2012-03-26 NOTE — Assessment & Plan Note (Signed)
Improving BMI with regular exercise program and low GI diet.

## 2012-03-26 NOTE — Patient Instructions (Addendum)
Compare the fiber content of the Atkins bars and your [employer] protein bars.  Also try the Mission low carb tortillas ,  They have 26 g of fiber   Also look for Dreamfield's  Pasta.  It is low carb and does not get digested so it works like fiber pill.   You need a minimum of  35 grams of fiber daily  You can take Colace (docusate) stool softener up to twice daily  100 mg   Try the chocolate covered almonds (in moderation) as a chocolate fix

## 2012-03-27 ENCOUNTER — Telehealth: Payer: Self-pay | Admitting: Internal Medicine

## 2012-03-27 NOTE — Telephone Encounter (Signed)
Patient wanting a call on her EKG result from yesterday.

## 2012-03-27 NOTE — Addendum Note (Signed)
Addended by: Sherlene Shams on: 03/27/2012 12:55 PM   Modules accepted: Orders

## 2012-03-27 NOTE — Telephone Encounter (Signed)
Sinus bradycardia (normal,.  Just slow) as long as she is not fainting,  It is not considered abnormal.

## 2012-03-28 NOTE — Telephone Encounter (Signed)
Spoke to patient via phone, gave her EKG RESULTS.

## 2012-06-25 ENCOUNTER — Ambulatory Visit (INDEPENDENT_AMBULATORY_CARE_PROVIDER_SITE_OTHER): Payer: BC Managed Care – PPO | Admitting: Internal Medicine

## 2012-06-25 ENCOUNTER — Encounter: Payer: Self-pay | Admitting: Internal Medicine

## 2012-06-25 VITALS — BP 128/88 | HR 48 | Temp 97.4°F | Resp 16 | Wt 196.5 lb

## 2012-06-25 DIAGNOSIS — I10 Essential (primary) hypertension: Secondary | ICD-10-CM

## 2012-06-25 DIAGNOSIS — L659 Nonscarring hair loss, unspecified: Secondary | ICD-10-CM

## 2012-06-25 DIAGNOSIS — E669 Obesity, unspecified: Secondary | ICD-10-CM

## 2012-06-25 DIAGNOSIS — I498 Other specified cardiac arrhythmias: Secondary | ICD-10-CM

## 2012-06-25 DIAGNOSIS — R001 Bradycardia, unspecified: Secondary | ICD-10-CM

## 2012-06-25 DIAGNOSIS — E785 Hyperlipidemia, unspecified: Secondary | ICD-10-CM

## 2012-06-25 DIAGNOSIS — L639 Alopecia areata, unspecified: Secondary | ICD-10-CM | POA: Insufficient documentation

## 2012-06-25 LAB — COMPREHENSIVE METABOLIC PANEL
ALT: 20 U/L (ref 0–35)
AST: 20 U/L (ref 0–37)
CO2: 27 mEq/L (ref 19–32)
Calcium: 9.6 mg/dL (ref 8.4–10.5)
Chloride: 107 mEq/L (ref 96–112)
GFR: 66.91 mL/min (ref >60.00)
Sodium: 141 mEq/L (ref 135–145)
Total Protein: 6.8 g/dL (ref 6.0–8.3)

## 2012-06-25 LAB — LIPID PANEL
Cholesterol: 250 mg/dL — ABNORMAL HIGH (ref 0–200)
HDL: 36 mg/dL — ABNORMAL LOW (ref >39.00)

## 2012-06-25 LAB — TSH: TSH: 1.85 u[IU]/mL (ref 0.35–5.50)

## 2012-06-25 NOTE — Assessment & Plan Note (Addendum)
Well controlled on current regimen. Renal function stable, no changes today. 

## 2012-06-25 NOTE — Assessment & Plan Note (Addendum)
Etiology unclear.  thyroid function screen was normal, will refer to dermatology if hair loss continues.

## 2012-06-25 NOTE — Assessment & Plan Note (Addendum)
Triglycerides have imporcen  byt her LDL has not yet improved  with wt loss.  Recommended low GI index diet and repeat in 3 months.

## 2012-06-25 NOTE — Assessment & Plan Note (Signed)
Improving with 28 lb wt loss thus far. Achieved with exercise and dietary modification

## 2012-06-25 NOTE — Assessment & Plan Note (Signed)
EKG was repeated.  Sinus bradycardia noted.  She is asymptomatic.

## 2012-06-25 NOTE — Progress Notes (Signed)
Patient ID: Joanne Benton, female   DOB: May 28, 1945, 67 y.o.   MRN: [id number]  Patient Active Problem List  Diagnosis  . Obesity (BMI 30-39.9)  . Lumbago without sciatica  . Hypertension  . Hyperlipidemia LDL goal < 100  . Hx of adenomatous colonic polyps  . Bradycardia    Subjective:  CC:   Chief Complaint  Patient presents with  . Follow-up    3 month    HPI:   Joanne Riceis a 67 y.o. female who presents Follow up on obesity and OA.  She has lost 28 lbs so far with dietary modification and exercise.  She is using the elliptical and a 3 wheeled bicycle for exercise.  She rode 5 miles yesterday.  Her osteoarthritis is aggravating her left knee but her knee pain is improved with exercise and aggravated by rest.   Cc: new onset hair loss noticed by hair dressor. No female pattern.   Some  emotional stressors ,  Husband had complicated hernia repair in January in  And she has been driving back and forth to [place].   Past Medical History  Diagnosis Date  . Obesity   . Hypertension   . Hyperlipidemia     with statin intolerance  . Knee pain   . Hx of adenomatous colonic polyps 2008    Sankar, repeat due 2013    Past Surgical History  Procedure Laterality Date  . Replacement total knee  August 2012    Right , [facility]       The following portions of the patient's history were reviewed and updated as appropriate: Allergies, current medications, and problem list.    Review of Systems:   Patient denies headache, fevers, malaise, unintentional weight loss, skin rash, eye pain, sinus congestion and sinus pain, sore throat, dysphagia,  hemoptysis , cough, dyspnea, wheezing, chest pain, palpitations, orthopnea, edema, abdominal pain, nausea, melena, diarrhea, constipation, flank pain, dysuria, hematuria, urinary  Frequency, nocturia, numbness, tingling, seizures,  Focal weakness, Loss of consciousness,  Tremor, insomnia, depression, anxiety, and suicidal ideation.     History    Social History  . Marital Status: Married    Spouse Name: N/A    Number of Children: N/A  . Years of Education: N/A   Occupational History  . Not on file.   Social History Main Topics  . Smoking status: Never Smoker   . Smokeless tobacco: Never Used  . Alcohol Use: No  . Drug Use: No  . Sexually Active: Not on file   Other Topics Concern  . Not on file   Social History Narrative   Married.    Objective:  BP 128/88  Pulse 48  Temp(Src) 97.4 F (36.3 C) (Oral)  Resp 16  Wt 196 lb 8 oz (89.132 kg)  BMI 34.82 kg/m2  SpO2 96%  General appearance: alert, cooperative and appears stated age Scalp: diffuse thinning  Ears: normal TM's and external ear canals both ears Throat: lips, mucosa, and tongue normal; teeth and gums normal Neck: no adenopathy, no carotid bruit, supple, symmetrical, trachea midline and thyroid not enlarged, symmetric, no tenderness/mass/nodules Back: symmetric, no curvature. ROM normal. No CVA tenderness. Lungs: clear to auscultation bilaterally Heart: regular rate and rhythm, S1, S2 normal, no murmur, click, rub or gallop Abdomen: soft, non-tender; bowel sounds normal; no masses,  no organomegaly Pulses: 2+ and symmetric Skin: Skin color, texture, turgor normal. No rashes or lesions Lymph nodes: Cervical, supraclavicular, and axillary nodes normal.  Assessment and Plan:  Obesity (  BMI 30-39.9) Improving with 28 lb wt loss thus far. Achieved with exercise and dietary modification  Hypertension Well controlled on current regimen. Renal function stable,  no changes today.  Hyperlipidemia LDL goal < 100 Triglycerides have imporcen  byt her LDL has not yet improved  with wt loss.  Recommended low GI index diet and repeat in 3 months.   Bradycardia EKG was repeated.  Sinus bradycardia noted.  She is asymptomatic.   Alopecia areata Etiology unclear.  thyroid function screen was normal, will refer to dermatology if hair loss continues.     Updated Medication List Outpatient Encounter Prescriptions as of 06/25/2012  Medication Sig Dispense Refill  . fluticasone (FLONASE) 50 MCG/ACT nasal spray Place 2 sprays into the nose daily.  32 g  6  . losartan (COZAAR) 100 MG tablet Take 1 tablet (100 mg total) by mouth daily.  60 tablet  6  . PARoxetine (PAXIL) 10 MG tablet Take 1 tablet (10 mg total) by mouth daily.  60 tablet  6   No facility-administered encounter medications on file as of 06/25/2012.

## 2012-06-25 NOTE — Patient Instructions (Addendum)
 This is  my version of a  "Low GI"  Diet:  It is not ultra low carb, but will still lower your blood sugars and allow you to lose 5 to 10 lbs per month if you follow it carefully. All of the foods can be found at grocery stores and in bulk at [employer].  The Atkins protein bars and shakes are available in more varieties at [employer], [employer] and [employer].     7 AM Breakfast:  Low carbohydrate Protein  Shakes (I recommend the EAS AdvantEdge "Carb Control" shakes  Or the low carb shakes by Atkins.   Both are available everywhere:  In  cases at [employer]Js  Or in 4 packs at grocery stores and pharmacies  2.5 carbs  (Alternative is  a toasted Arnold's Sandwhich Thin w/ peanut butter, a "Bagel Thin" with cream cheese and salmon) or  a scrambled egg burrito made with a low carb tortilla .  Avoid cereal and bananas, oatmeal too unless you are cooking the old fashioned kind that takes 30-40 minutes to prepare.  the rest is overly processed, has minimal fiber, and is loaded with carbohydrates!   10 AM: Protein bar by Atkins (the snack size, under 200 cal).  There are many varieties , available widely again or in bulk in limited varieties at BJs)  Other so called "protein bars" tend to be loaded with carbohydrates.  Remember, in food advertising, the word "energy" is synonymous for " carbohydrate."  Lunch: sandwich of turkey, (or any lunchmeat, grilled meat or canned tuna), fresh avocado, mayonnaise  and cheese on a lower carbohydrate pita bread, flatbread, or tortilla . Ok to use regular mayonnaise. The bread is the only source or carbohydrate that can be decreased (Joseph's makes a pita bread and a flat bread that are 50 cal and 4 net carbs ; Toufayan makes a low carb flatbread that's 100 cal and 9 net carbs  and  Mission makes a low carb whole wheat tortilla  That is 210 cal and 6 net carbs)  3 PM:  Mid day :  Another protein bar,  Or a  cheese stick (100 cal, 0 carbs),  Or 1 ounce of  almonds, walnuts, pistachios,  pecans, peanuts,  Macadamia nuts. Or a Dannon light n Fit greek yogurt, 80 cal 8 net carbs . Avoid "granola"; the dried cranberries and raisins are loaded with carbohydrates. Mixed nuts ok if no raisins or cranberries or dried fruit.      6 PM  Dinner:  "mean and green:"  Meat/chicken/fish or a high protein legume; , with a green salad, and a low GI  Veggie (broccoli, cauliflower, green beans, spinach, brussel sprouts. Lima beans) : Avoid "Low fat dressings, as well as [place] and [place]! They are loaded with sugar! Instead use ranch, vinagrette,  Blue cheese, etc.  There is a low carb pasta by Dreamfield's available at [employer] that is acceptable and tastes great. Try Michel Angel's chicken piccata over low carb pasta. The chicken dish is 0 carbs, and can be found in frozen section at [employer]Js and [employer]. Also try Aaron Sanchez's "Carnitas" (pulled pork, no sauce,  0 carbs) and his pot roast.   both are in the refrigerated section at [employer]Js   Dreamfield's makes a low carb pasta only 5 g/serving.  Available at all grocery stores,  And tastes like normal pasta  9 PM snack : Breyer's "low carb" fudgsicle or  ice cream bar (Carb Smart   line), or  Weight Watcher's ice cream bar , or another "no sugar added" ice cream;a serving of fresh berries/cherries with whipped cream (Avoid bananas, pineapple, grapes  and watermelon on a regular basis because they are high in sugar)   Remember that snack Substitutions should be less than 10 carbs per serving and meals < 20 carbs. Remember to subtract fiber grams and sugar alcohols to get the "net carbs."  

## 2012-06-26 MED ORDER — ATORVASTATIN CALCIUM 20 MG PO TABS: 20.0000 mg | ORAL_TABLET | Freq: Every day | ORAL | Status: DC

## 2012-06-26 MED ORDER — ASPIRIN 81 MG PO TBEC: 81.0000 mg | DELAYED_RELEASE_TABLET | Freq: Every day | ORAL | Status: DC

## 2012-06-26 NOTE — Addendum Note (Signed)
Addended by: Sherlene Shams on: 06/26/2012 07:09 AM   Modules accepted: Orders

## 2012-06-27 ENCOUNTER — Telehealth: Payer: Self-pay | Admitting: Internal Medicine

## 2012-06-27 NOTE — Telephone Encounter (Signed)
Pt states she spoke with you yesterday regarding cholesterol med she was prescribed.  States she has not started it yet and is waiting to hear if Dr. Darrick Huntsman feels that it is absolutely necessary for her to begin this medication as it gives her terrible muscle spasms and cramps.  Please call pt at work [phone].

## 2012-06-27 NOTE — Telephone Encounter (Signed)
Pt.notified

## 2012-11-14 LAB — HM PAP SMEAR: HM Pap smear: NORMAL

## 2012-12-03 ENCOUNTER — Encounter: Payer: Self-pay | Admitting: Internal Medicine

## 2012-12-03 ENCOUNTER — Ambulatory Visit (INDEPENDENT_AMBULATORY_CARE_PROVIDER_SITE_OTHER): Payer: BC Managed Care – PPO | Admitting: Internal Medicine

## 2012-12-03 VITALS — BP 160/80 | HR 61 | Temp 98.4°F | Resp 14 | Ht 63.0 in | Wt 195.8 lb

## 2012-12-03 DIAGNOSIS — R252 Cramp and spasm: Secondary | ICD-10-CM

## 2012-12-03 DIAGNOSIS — Z23 Encounter for immunization: Secondary | ICD-10-CM

## 2012-12-03 DIAGNOSIS — I10 Essential (primary) hypertension: Secondary | ICD-10-CM

## 2012-12-03 DIAGNOSIS — Z1239 Encounter for other screening for malignant neoplasm of breast: Secondary | ICD-10-CM

## 2012-12-03 DIAGNOSIS — Z0000 Encounter for general adult medical examination without abnormal findings: Secondary | ICD-10-CM | POA: Insufficient documentation

## 2012-12-03 DIAGNOSIS — E559 Vitamin D deficiency, unspecified: Secondary | ICD-10-CM

## 2012-12-03 DIAGNOSIS — E669 Obesity, unspecified: Secondary | ICD-10-CM

## 2012-12-03 LAB — CBC WITH DIFFERENTIAL/PLATELET
Basophils Relative: 0.5 % (ref 0.0–3.0)
Eosinophils Absolute: 0.1 10*3/uL (ref 0.0–0.7)
HCT: 38.8 % (ref 36.0–46.0)
Hemoglobin: 13.4 g/dL (ref 12.0–15.0)
Lymphs Abs: 1.8 10*3/uL (ref 0.7–4.0)
MCHC: 34.5 g/dL (ref 30.0–36.0)
MCV: 91.5 fl (ref 78.0–100.0)
Monocytes Absolute: 0.4 10*3/uL (ref 0.1–1.0)
Neutro Abs: 3.2 10*3/uL (ref 1.4–7.7)
RBC: 4.24 Mil/uL (ref 3.87–5.11)
RDW: 13.5 % (ref 11.5–14.6)

## 2012-12-03 LAB — COMPREHENSIVE METABOLIC PANEL
ALT: 17 U/L (ref 0–35)
AST: 19 U/L (ref 0–37)
Albumin: 3.9 g/dL (ref 3.5–5.2)
Calcium: 9.2 mg/dL (ref 8.4–10.5)
Chloride: 106 mEq/L (ref 96–112)
Potassium: 4.1 mEq/L (ref 3.5–5.1)

## 2012-12-03 LAB — LIPID PANEL
Total CHOL/HDL Ratio: 6
Triglycerides: 201 mg/dL — ABNORMAL HIGH (ref 0.0–149.0)

## 2012-12-03 LAB — MAGNESIUM: Magnesium: 1.8 mg/dL (ref 1.5–2.5)

## 2012-12-03 MED ORDER — LOSARTAN POTASSIUM-HCTZ 100-25 MG PO TABS: 1.0000 | ORAL_TABLET | Freq: Every day | ORAL | Status: DC

## 2012-12-03 NOTE — Assessment & Plan Note (Signed)
I have addressed  BMI and commended her one her weight loss.  Reviewed  low glycemic index diet utilizing smaller more frequent meals to increase metabolism and regular exercise with a goal of 30 minutes of aerobic exercise a minimum of 5 days per week. Screening for lipid disorders, thyroid and diabetes to be done today.

## 2012-12-03 NOTE — Assessment & Plan Note (Signed)
Annual comprehensive exam was done including breast, pelvic was done  All screenings have been addressed .

## 2012-12-03 NOTE — Patient Instructions (Addendum)
You had your annual wellness exam today.  You have no cervix so you never need another PAP smear!!!  We will schedule your mammogram soon at [facility].  You had your TDaP vaccine today.    I have changed your BP medication to losarta/hct bc the losartan was already maxed out.  If you tolerate it we will refill at 90 day intervals  I do recommend taking a baby aspirin daily to help keep your arteries open  Add an ounce of walnuts or pecans daily to your yogurt instead of the peanut butter.  We will contact you with the bloodwork results

## 2012-12-03 NOTE — Assessment & Plan Note (Signed)
Will increase the losartan  To 100 mg daily

## 2012-12-03 NOTE — Progress Notes (Signed)
Patient ID: Joanne Benton, female   DOB: 06-27-1945, 67 y.o.   MRN: [id number]  Subjective:     Joanne Benton is a 67 y.o. female and is here for a comprehensive physical exam. The patient reports no problems.  History   Social History  . Marital Status: Married    Spouse Name: N/A    Number of Children: N/A  . Years of Education: N/A   Occupational History  . Not on file.   Social History Main Topics  . Smoking status: Never Smoker   . Smokeless tobacco: Never Used  . Alcohol Use: No  . Drug Use: No  . Sexual Activity: Not on file   Other Topics Concern  . Not on file   Social History Narrative   Married.   Health Maintenance  Topic Date Due  . Tetanus/tdap  01/17/1967  . Influenza Vaccine  12/17/2012  . Mammogram  11/14/2013  . Pap Smear  12/16/2014  . Colonoscopy  10/05/2021  . Zostavax  Completed    The following portions of the patient's history were reviewed and updated as appropriate: allergies, current medications, past family history, past medical history, past social history, past surgical history and problem list.  Review of Systems A comprehensive review of systems was negative.   Objective:   BP 160/80  Pulse 61  Temp(Src) 98.4 F (36.9 C) (Oral)  Resp 14  Ht 5\' 3"  (1.6 m)  Wt 195 lb 12 oz (88.792 kg)  BMI 34.68 kg/m2  SpO2 97% General Appearance:    Alert, cooperative, no distress, appears stated age  Head:    Normocephalic, without obvious abnormality, atraumatic  Eyes:    PERRL, conjunctiva/corneas clear, EOM's intact, fundi    benign, both eyes  Ears:    Normal TM's and external ear canals, both ears  Nose:   Nares normal, septum midline, mucosa normal, no drainage    or sinus tenderness  Throat:   Lips, mucosa, and tongue normal; teeth and gums normal  Neck:   Supple, symmetrical, trachea midline, no adenopathy;    thyroid:  no enlargement/tenderness/nodules; no carotid   bruit or JVD  Back:     Symmetric, no curvature, ROM normal, no CVA  tenderness  Lungs:     Clear to auscultation bilaterally, respirations unlabored  Chest Wall:    No tenderness or deformity   Heart:    Regular rate and rhythm, S1 and S2 normal, no murmur, rub   or gallop  Breast Exam:    No tenderness, masses, or nipple abnormality  Abdomen:     Soft, non-tender, bowel sounds active all four quadrants,    no masses, no organomegaly  Genitalia:    Pelvic: external genitalia normal, no adnexal masses or tenderness, cervix and uterus surgically absent , rectovaginal septum normal, and vagina normal without discharge  Extremities:   Extremities normal, atraumatic, no cyanosis or edema  Pulses:   2+ and symmetric all extremities  Skin:   Skin color, texture, turgor normal, no rashes or lesions  Lymph nodes:   Cervical, supraclavicular, and axillary nodes normal  Neurologic:   CNII-XII intact, normal strength, sensation and reflexes    throughout      Assessment:    Hypertension Will increase the losartan  To 100 mg daily  Routine general medical examination at a health care facility Annual comprehensive exam was done including breast, pelvic was done  All screenings have been addressed .   Obesity (BMI 30-39.9) I have addressed  BMI and commended her one her weight loss.  Reviewed  low glycemic index diet utilizing smaller more frequent meals to increase metabolism and regular exercise with a goal of 30 minutes of aerobic exercise a minimum of 5 days per week. Screening for lipid disorders, thyroid and diabetes to be done today.     Updated Medication List Outpatient Encounter Prescriptions as of 12/03/2012  Medication Sig Dispense Refill  . aspirin 81 MG EC tablet Take 1 tablet (81 mg total) by mouth daily. Swallow whole.  30 tablet  12  . fluticasone (FLONASE) 50 MCG/ACT nasal spray Place 2 sprays into the nose daily.  32 g  6  . PARoxetine (PAXIL) 10 MG tablet Take 0.5 mg by mouth daily.      . [DISCONTINUED] losartan (COZAAR) 100 MG tablet Take  1 tablet (100 mg total) by mouth daily.  60 tablet  6  . [DISCONTINUED] PARoxetine (PAXIL) 10 MG tablet Take 1 tablet (10 mg total) by mouth daily.  60 tablet  6  . losartan-hydrochlorothiazide (HYZAAR) 100-25 MG per tablet Take 1 tablet by mouth daily.  30 tablet  0  . [DISCONTINUED] atorvastatin (LIPITOR) 20 MG tablet Take 1 tablet (20 mg total) by mouth daily.  90 tablet  3   No facility-administered encounter medications on file as of 12/03/2012.

## 2012-12-04 LAB — VITAMIN D 25 HYDROXY (VIT D DEFICIENCY, FRACTURES): Vit D, 25-Hydroxy: 36 ng/mL (ref 30–89)

## 2012-12-06 ENCOUNTER — Telehealth: Payer: Self-pay | Admitting: Internal Medicine

## 2012-12-06 NOTE — Telephone Encounter (Signed)
error 

## 2012-12-10 ENCOUNTER — Telehealth: Payer: Self-pay | Admitting: Internal Medicine

## 2012-12-10 NOTE — Telephone Encounter (Signed)
Relayed message to pt

## 2012-12-10 NOTE — Telephone Encounter (Signed)
The patient will call back when she gets out of her meeting. She is returning your call from Friday.

## 2012-12-10 NOTE — Telephone Encounter (Signed)
Patient just needs to notified if she refuses to try a statin for her cholesterol that she needs to take a 81 mg aspirin daily.

## 2012-12-10 NOTE — Telephone Encounter (Signed)
Patient called back and states someone left her a message on Friday however her husband erased the message before writing down the callers name.  I thought maybe it was in reference to her labs and she said no it wasn't that since she came by on Thursday and was giving the lab print out. She did say it might be in reference to a mammogram. She will try calling back in a few hours when she has another break.  I will be glad to give her a message if you need me to once she calls back.

## 2012-12-20 ENCOUNTER — Ambulatory Visit: Payer: Self-pay | Admitting: Internal Medicine

## 2013-01-02 ENCOUNTER — Encounter: Payer: Self-pay | Admitting: Internal Medicine

## 2013-01-03 ENCOUNTER — Ambulatory Visit (INDEPENDENT_AMBULATORY_CARE_PROVIDER_SITE_OTHER): Payer: Medicare Other | Admitting: Internal Medicine

## 2013-01-03 ENCOUNTER — Encounter: Payer: Self-pay | Admitting: Internal Medicine

## 2013-01-03 VITALS — BP 178/80 | HR 60 | Temp 97.9°F | Resp 14 | Ht 63.0 in | Wt 194.5 lb

## 2013-01-03 DIAGNOSIS — Z888 Allergy status to other drugs, medicaments and biological substances status: Secondary | ICD-10-CM

## 2013-01-03 DIAGNOSIS — Z789 Other specified health status: Secondary | ICD-10-CM | POA: Insufficient documentation

## 2013-01-03 DIAGNOSIS — I10 Essential (primary) hypertension: Secondary | ICD-10-CM

## 2013-01-03 MED ORDER — LOSARTAN POTASSIUM-HCTZ 100-25 MG PO TABS: 1.0000 | ORAL_TABLET | Freq: Every day | ORAL | Status: DC

## 2013-01-03 MED ORDER — PAROXETINE HCL 10 MG PO TABS: 10.0000 mg | ORAL_TABLET | Freq: Every day | ORAL | Status: DC

## 2013-01-03 NOTE — Progress Notes (Signed)
Patient ID: Joanne Benton, female   DOB: 10-08-1945, 67 y.o.   MRN: [id number]   Patient Active Problem List   Diagnosis Date Noted  . Statin intolerance 01/03/2013  . Routine general medical examination at a health care facility 12/03/2012  . Nocturnal muscle cramps 12/03/2012  . Alopecia areata 06/25/2012  . Bradycardia 03/26/2012  . Hx of adenomatous colonic polyps   . Obesity (BMI 30-39.9) 04/14/2011  . Lumbago without sciatica 04/14/2011  . Hypertension 04/14/2011  . Hyperlipidemia LDL goal < 100 04/14/2011    Subjective:  CC:   Chief Complaint  Patient presents with  . Follow-up    1 month , left ear is being treated for a virus.    HPI:   Joanne Riceis a 67 y.o. female who presents for follow up on hypertension noted at last visit. Losartan was increased to 100 mg daily .  She has been on a 10 day prednisone taper for sudden hearing loss by [facility] .  Her  had improved on losartan increased dose until the prednisone began , with home readings improved to 138/77.  She has increased paxil to 10 mg daily for jitteriness caused by the prednisone    Past Medical History  Diagnosis Date  . Obesity   . Hypertension   . Hyperlipidemia     with statin intolerance  . Knee pain   . Hx of adenomatous colonic polyps 2008    Sankar, repeat due 2013    Past Surgical History  Procedure Laterality Date  . Replacement total knee  August 2012    Right , [facility]  . Abdominal hysterectomy  1978    secondary to IUD complicaiton        The following portions of the patient's history were reviewed and updated as appropriate: Allergies, current medications, and problem list.    Review of Systems:   Patient denies headache, fevers, malaise, unintentional weight loss, skin rash, eye pain, sinus congestion and sinus pain, sore throat, dysphagia,  hemoptysis , cough, dyspnea, wheezing, chest pain, palpitations, orthopnea, edema, abdominal pain, nausea, melena, diarrhea,  constipation, flank pain, dysuria, hematuria, urinary  Frequency, nocturia, numbness, tingling, seizures,  Focal weakness, Loss of consciousness,  Tremor, insomnia, depression, anxiety, and suicidal ideation.     History   Social History  . Marital Status: Married    Spouse Name: N/A    Number of Children: N/A  . Years of Education: N/A   Occupational History  . Not on file.   Social History Main Topics  . Smoking status: Never Smoker   . Smokeless tobacco: Never Used  . Alcohol Use: No  . Drug Use: No  . Sexual Activity: Not on file   Other Topics Concern  . Not on file   Social History Narrative   Married.    Objective:  Filed Vitals:   01/03/13 0853  BP: 178/80  Pulse: 60  Temp: 97.9 F (36.6 C)  Resp: 14     General appearance: alert, cooperative and appears stated age Ears: normal TM's and external ear canals both ears Throat: lips, mucosa, and tongue normal; teeth and gums normal Neck: no adenopathy, no carotid bruit, supple, symmetrical, trachea midline and thyroid not enlarged, symmetric, no tenderness/mass/nodules Back: symmetric, no curvature. ROM normal. No CVA tenderness. Lungs: clear to auscultation bilaterally Heart: regular rate and rhythm, S1, S2 normal, no murmur, click, rub or gallop Abdomen: soft, non-tender; bowel sounds normal; no masses,  no organomegaly Pulses: 2+ and symmetric  Skin: Skin color, texture, turgor normal. No rashes or lesions Lymph nodes: Cervical, supraclavicular, and axillary nodes normal.  Assessment and Plan:  Hypertension Improved transiently with suspension of meloxicam and increase in losartan, but prednisone is elevating her blood pressure.  No changes today    Updated Medication List Outpatient Encounter Prescriptions as of 01/03/2013  Medication Sig Dispense Refill  . aspirin 81 MG EC tablet Take 1 tablet (81 mg total) by mouth daily. Swallow whole.  30 tablet  12  . fluticasone (FLONASE) 50 MCG/ACT nasal  spray Place 2 sprays into the nose daily.  32 g  6  . losartan-hydrochlorothiazide (HYZAAR) 100-25 MG per tablet Take 1 tablet by mouth daily.  60 tablet  0  . PARoxetine (PAXIL) 10 MG tablet Take 1 tablet (10 mg total) by mouth daily.  90 tablet  3  . predniSONE (DELTASONE) 10 MG tablet Take 6 tablets by mouth daily.      . [DISCONTINUED] losartan-hydrochlorothiazide (HYZAAR) 100-25 MG per tablet Take 1 tablet by mouth daily.  30 tablet  0  . [DISCONTINUED] PARoxetine (PAXIL) 10 MG tablet Take 0.5 mg by mouth daily.       No facility-administered encounter medications on file as of 01/03/2013.     No orders of the defined types were placed in this encounter.    No Follow-up on file.

## 2013-01-03 NOTE — Patient Instructions (Addendum)
Return for an RN visit in early October with your bp cuff so Olegario Messier can check your pressure and your machine

## 2013-01-05 ENCOUNTER — Encounter: Payer: Self-pay | Admitting: Internal Medicine

## 2013-01-05 NOTE — Assessment & Plan Note (Signed)
Improved transiently with suspension of meloxicam and increase in losartan, but prednisone is elevating her blood pressure.  No changes today

## 2013-01-17 ENCOUNTER — Ambulatory Visit: Payer: Self-pay | Admitting: General Practice

## 2013-01-28 ENCOUNTER — Ambulatory Visit: Payer: Medicare Other | Admitting: Internal Medicine

## 2013-02-01 ENCOUNTER — Ambulatory Visit (INDEPENDENT_AMBULATORY_CARE_PROVIDER_SITE_OTHER): Payer: Medicare Other | Admitting: Internal Medicine

## 2013-02-01 ENCOUNTER — Encounter: Payer: Self-pay | Admitting: Internal Medicine

## 2013-02-01 VITALS — BP 112/72 | HR 63 | Temp 98.2°F | Resp 14 | Wt 196.5 lb

## 2013-02-01 DIAGNOSIS — I10 Essential (primary) hypertension: Secondary | ICD-10-CM

## 2013-02-01 NOTE — Assessment & Plan Note (Addendum)
Well controlled on current regimen. Renal function stable, no changes today.  Lab Results  Component Value Date   CREATININE 0.8 12/03/2012

## 2013-02-01 NOTE — Progress Notes (Signed)
Patient ID: Joanne Benton, female   DOB: 1945/06/21, 67 y.o.   MRN: [id number]    Patient Active Problem List   Diagnosis Date Noted  . Statin intolerance 01/03/2013  . Routine general medical examination at a health care facility 12/03/2012  . Nocturnal muscle cramps 12/03/2012  . Alopecia areata 06/25/2012  . Bradycardia 03/26/2012  . Hx of adenomatous colonic polyps   . Obesity (BMI 30-39.9) 04/14/2011  . Lumbago without sciatica 04/14/2011  . Hypertension 04/14/2011  . Hyperlipidemia LDL goal < 100 04/14/2011    Subjective:  CC:   Chief Complaint  Patient presents with  . Follow-up    HPI:   Joanne Riceis a 67 y.o. female who presents Follow up on hypertension and medication initiation.  She is tolerating losartan.hct  Without issues  She takes it at night.,  No nocturia reported,  She is having  surgery next week by Dr Ernest Pine on her knee ,  Has stopped her aspirin .  Her hearing loss has nearly 100% resolved . She denies chest pain, orthopnea and shortness of breath. No history of DM or renal insufficiency.    Past Medical History  Diagnosis Date  . Obesity   . Hypertension   . Hyperlipidemia     with statin intolerance  . Knee pain   . Hx of adenomatous colonic polyps 2008    Sankar, repeat due 2013    Past Surgical History  Procedure Laterality Date  . Replacement total knee  August 2012    Right , [facility]  . Abdominal hysterectomy  1978    secondary to IUD complicaiton        The following portions of the patient's history were reviewed and updated as appropriate: Allergies, current medications, and problem list.    Review of Systems:   12 Pt  review of systems was negative except those addressed in the HPI,     History   Social History  . Marital Status: Married    Spouse Name: N/A    Number of Children: N/A  . Years of Education: N/A   Occupational History  . Not on file.   Social History Main Topics  . Smoking status: Never Smoker   .  Smokeless tobacco: Never Used  . Alcohol Use: No  . Drug Use: No  . Sexual Activity: Not on file   Other Topics Concern  . Not on file   Social History Narrative   Married.    Objective:  Filed Vitals:   02/01/13 1038  BP: 112/72  Pulse: 63  Temp: 98.2 F (36.8 C)  Resp: 14     General appearance: alert, cooperative and appears stated age Ears: normal TM's and external ear canals both ears Throat: lips, mucosa, and tongue normal; teeth and gums normal Neck: no adenopathy, no carotid bruit, supple, symmetrical, trachea midline and thyroid not enlarged, symmetric, no tenderness/mass/nodules Back: symmetric, no curvature. ROM normal. No CVA tenderness. Lungs: clear to auscultation bilaterally Heart: regular rate and rhythm, S1, S2 normal, no murmur, click, rub or gallop Abdomen: soft, non-tender; bowel sounds normal; no masses,  no organomegaly Pulses: 2+ and symmetric Skin: Skin color, texture, turgor normal. No rashes or lesions Lymph nodes: Cervical, supraclavicular, and axillary nodes normal.  Assessment and Plan:  Hypertension Well controlled on current regimen. Renal function stable, no changes today.  Lab Results  Component Value Date   CREATININE 0.8 12/03/2012    Updated Medication List Outpatient Encounter Prescriptions as of 02/01/2013  Medication  Sig Dispense Refill  . fluticasone (FLONASE) 50 MCG/ACT nasal spray Place 2 sprays into the nose daily.  32 g  6  . losartan-hydrochlorothiazide (HYZAAR) 100-25 MG per tablet Take 1 tablet by mouth daily.  60 tablet  0  . PARoxetine (PAXIL) 10 MG tablet Take 1 tablet (10 mg total) by mouth daily.  90 tablet  3  . aspirin 81 MG EC tablet Take 1 tablet (81 mg total) by mouth daily. Swallow whole.  30 tablet  12  . [DISCONTINUED] predniSONE (DELTASONE) 10 MG tablet Take 6 tablets by mouth daily.       No facility-administered encounter medications on file as of 02/01/2013.     No orders of the defined types  were placed in this encounter.    No Follow-up on file.

## 2013-02-03 ENCOUNTER — Encounter: Payer: Self-pay | Admitting: Internal Medicine

## 2013-02-04 ENCOUNTER — Ambulatory Visit: Payer: Self-pay | Admitting: Anesthesiology

## 2013-02-04 DIAGNOSIS — I10 Essential (primary) hypertension: Secondary | ICD-10-CM

## 2013-02-08 ENCOUNTER — Ambulatory Visit: Payer: Self-pay | Admitting: General Practice

## 2013-02-26 ENCOUNTER — Other Ambulatory Visit: Payer: Self-pay | Admitting: Internal Medicine

## 2013-04-09 ENCOUNTER — Other Ambulatory Visit: Payer: Self-pay | Admitting: Internal Medicine

## 2013-07-04 ENCOUNTER — Other Ambulatory Visit: Payer: Self-pay | Admitting: Internal Medicine

## 2013-08-05 ENCOUNTER — Encounter: Payer: Self-pay | Admitting: Internal Medicine

## 2013-08-05 ENCOUNTER — Ambulatory Visit (INDEPENDENT_AMBULATORY_CARE_PROVIDER_SITE_OTHER): Payer: Medicare Other | Admitting: Internal Medicine

## 2013-08-05 VITALS — BP 112/70 | HR 59 | Temp 98.3°F | Resp 16 | Wt 203.5 lb

## 2013-08-05 DIAGNOSIS — R739 Hyperglycemia, unspecified: Secondary | ICD-10-CM

## 2013-08-05 DIAGNOSIS — R7309 Other abnormal glucose: Secondary | ICD-10-CM

## 2013-08-05 DIAGNOSIS — E669 Obesity, unspecified: Secondary | ICD-10-CM

## 2013-08-05 DIAGNOSIS — I10 Essential (primary) hypertension: Secondary | ICD-10-CM

## 2013-08-05 DIAGNOSIS — E785 Hyperlipidemia, unspecified: Secondary | ICD-10-CM

## 2013-08-05 LAB — COMPREHENSIVE METABOLIC PANEL
ALT: 22 U/L (ref 0–35)
AST: 23 U/L (ref 0–37)
Albumin: 4 g/dL (ref 3.5–5.2)
Alkaline Phosphatase: 56 U/L (ref 39–117)
BILIRUBIN TOTAL: 0.7 mg/dL (ref 0.3–1.2)
BUN: 14 mg/dL (ref 6–23)
CHLORIDE: 103 meq/L (ref 96–112)
CO2: 28 meq/L (ref 19–32)
Calcium: 9.6 mg/dL (ref 8.4–10.5)
Creatinine, Ser: 0.9 mg/dL (ref 0.4–1.2)
GFR: 67.54 mL/min (ref >60.00)
GLUCOSE: 91 mg/dL (ref 70–99)
POTASSIUM: 4.4 meq/L (ref 3.5–5.1)
SODIUM: 141 meq/L (ref 135–145)
Total Protein: 7 g/dL (ref 6.0–8.3)

## 2013-08-05 LAB — LIPID PANEL
CHOLESTEROL: 273 mg/dL — AB (ref 0–200)
HDL: 39.5 mg/dL (ref >39.00)
LDL CALC: 153 mg/dL — AB (ref 0–99)
Total CHOL/HDL Ratio: 7
Triglycerides: 403 mg/dL — ABNORMAL HIGH (ref 0.0–149.0)
VLDL: 80.6 mg/dL — ABNORMAL HIGH (ref 0.0–40.0)

## 2013-08-05 LAB — TSH: TSH: 1.55 u[IU]/mL (ref 0.35–5.50)

## 2013-08-05 LAB — HEMOGLOBIN A1C: Hgb A1c MFr Bld: 5.1 % (ref 4.6–6.5)

## 2013-08-05 MED ORDER — PAROXETINE HCL 10 MG PO TABS
10.0000 mg | ORAL_TABLET | Freq: Every day | ORAL | Status: DC
Start: 2013-08-05 — End: 2014-08-22

## 2013-08-05 MED ORDER — PHENTERMINE HCL 37.5 MG PO TABS: 19.0000 mg | ORAL_TABLET | Freq: Two times a day (BID) | ORAL | Status: DC

## 2013-08-05 NOTE — Assessment & Plan Note (Addendum)
She has had difficulty losing weight due to increased appetite and is requesting a trial of  Phentermine.  She is aware of the possible side effects and risks and understands that    The medication will be discontinued if she has not lost 5% of her body weight over the next 3 months, which , based on today's weight is 10 lbs.  She has no signs of DM but low GI diet again recommended.   Lab Results  Component Value Date   HGBA1C 5.1 08/05/2013

## 2013-08-05 NOTE — Patient Instructions (Addendum)
Contine Paxil 10 mg daily  Trial of phentermine,  1/2 tablet once or twice daily to help you lose weight by suppressing your appetite  If it raised your blood pressure or makes you feel jittery,  Stop it and let me know  Our goal is a minimum of 10 lbs over the next 3 months, but your goal is 20 lbs.   Phentermine tablets or capsules What is this medicine? PHENTERMINE (FEN ter meen) decreases your appetite. It is used with a reduced calorie diet and exercise to help you lose weight. This medicine may be used for other purposes; ask your health care provider or pharmacist if you have questions. COMMON BRAND NAME(S): Adipex-P, Atti-Plex P , Atti-Plex P Spansule , Fastin, Pro-Fast, Tara-8  What should I tell my health care provider before I take this medicine? They need to know if you have any of these conditions: -agitation -glaucoma -heart disease -high blood pressure -history of substance abuse -lung disease called Primary Pulmonary Hypertension (PPH) -taken an MAOI like Carbex, Eldepryl, Marplan, Nardil, or Parnate in last 14 days -thyroid disease -an unusual or allergic reaction to phentermine, other medicines, foods, dyes, or preservatives -pregnant or trying to get pregnant -breast-feeding How should I use this medicine? Take this medicine by mouth with a glass of water. Follow the directions on the prescription label. This medicine is usually taken 30 minutes before or 1 to 2 hours after breakfast. Avoid taking this medicine in the evening. It may interfere with sleep. Take your doses at regular intervals. Do not take your medicine more often than directed. Talk to your pediatrician regarding the use of this medicine in children. Special care may be needed. Overdosage: If you think you have taken too much of this medicine contact a poison control center or emergency room at once. NOTE: This medicine is only for you. Do not share this medicine with others. What if I miss a  dose? If you miss a dose, take it as soon as you can. If it is almost time for your next dose, take only that dose. Do not take double or extra doses. What may interact with this medicine? Do not take this medicine with any of the following medications: -duloxetine -MAOIs like Carbex, Eldepryl, Marplan, Nardil, and Parnate -medicines for colds or breathing difficulties like pseudoephedrine or phenylephrine -procarbazine -sibutramine -SSRIs like citalopram, escitalopram, fluoxetine, fluvoxamine, paroxetine, and sertraline -stimulants like dexmethylphenidate, methylphenidate or modafinil -venlafaxine This medicine may also interact with the following medications: -medicines for diabetes This list may not describe all possible interactions. Give your health care provider a list of all the medicines, herbs, non-prescription drugs, or dietary supplements you use. Also tell them if you smoke, drink alcohol, or use illegal drugs. Some items may interact with your medicine. What should I watch for while using this medicine? Notify your physician immediately if you become short of breath while doing your normal activities. Do not take this medicine within 6 hours of bedtime. It can keep you from getting to sleep. Avoid drinks that contain caffeine and try to stick to a regular bedtime every night. This medicine was intended to be used in addition to a healthy diet and exercise. The best results are achieved this way. This medicine is only indicated for short-term use. Eventually your weight loss may level out. At that point, the drug will only help you maintain your new weight. Do not increase or in any way change your dose without consulting your doctor. You may  get drowsy or dizzy. Do not drive, use machinery, or do anything that needs mental alertness until you know how this medicine affects you. Do not stand or sit up quickly, especially if you are an older patient. This reduces the risk of dizzy or  fainting spells. Alcohol may increase dizziness and drowsiness. Avoid alcoholic drinks. What side effects may I notice from receiving this medicine? Side effects that you should report to your doctor or health care professional as soon as possible: -chest pain, palpitations -depression or severe changes in mood -increased blood pressure -irritability -nervousness or restlessness -severe dizziness -shortness of breath -problems urinating -unusual swelling of the legs -vomiting Side effects that usually do not require medical attention (report to your doctor or health care professional if they continue or are bothersome): -blurred vision or other eye problems -changes in sexual ability or desire -constipation or diarrhea -difficulty sleeping -dry mouth or unpleasant taste -headache -nausea This list may not describe all possible side effects. Call your doctor for medical advice about side effects. You may report side effects to FDA at 1-800-FDA-1088. Where should I keep my medicine? Keep out of the reach of children. This medicine can be abused. Keep your medicine in a safe place to protect it from theft. Do not share this medicine with anyone. Selling or giving away this medicine is dangerous and against the law. Store at room temperature between 20 and 25 degrees C (68 and 77 degrees F). Keep container tightly closed. Throw away any unused medicine after the expiration date. NOTE: This sheet is a summary. It may not cover all possible information. If you have questions about this medicine, talk to your doctor, pharmacist, or health care provider.  2014, [employer] Standard. (2010-05-19 11:02:44)

## 2013-08-05 NOTE — Progress Notes (Signed)
Patient ID: Joanne Benton, female   DOB: 06/02/1945, 68 y.o.   MRN: [id number]   Patient Active Problem List   Diagnosis Date Noted  . Statin intolerance 01/03/2013  . Routine general medical examination at a health care facility 12/03/2012  . Nocturnal muscle cramps 12/03/2012  . Alopecia areata 06/25/2012  . Bradycardia 03/26/2012  . Hx of adenomatous colonic polyps   . Obesity (BMI 30-39.9) 04/14/2011  . Lumbago without sciatica 04/14/2011  . Hypertension 04/14/2011  . Hyperlipidemia LDL goal < 100 04/14/2011    Subjective:  CC:   Chief Complaint  Patient presents with  . Follow-up    6 month    HPI:   Joanne Benton is a 68 y.o. female who presents for Follow up on hypertension,  hyperlipidemia and obesity .  Had knee arthroscopy done in October by Dr. Marry Guan  For persistent pain and swelling of left knee.  Happy with the outcome.  3 weeks later husband had cervical spine decompression for critical stenosis and was disabled postoperatively for several weeks due to worsening bilateral arm weakness.  Patient has  gained a few lbs due to husband's slow recovery from cervical spine decompression resulting in her suspension of exercise regimen and engaging in  Overeating due to anxiety .  Her anxiety has improved with paxil, but she is overeating regularly and  having excessive gas with eating. Frustrated by the weight gain and increased appetite.  Very motivated to lose weight and asking for help.      Past Medical History  Diagnosis Date  . Obesity   . Hypertension   . Hyperlipidemia     with statin intolerance  . Knee pain   . Hx of adenomatous colonic polyps 2008    Sankar, repeat due 2013    Past Surgical History  Procedure Laterality Date  . Replacement total knee  August 2012    Right , [facility]  . Abdominal hysterectomy  1978    secondary to IUD complicaiton        The following portions of the patient's history were reviewed and updated as appropriate:  Allergies, current medications, and problem list.    Review of Systems:   Patient denies headache, fevers, malaise, unintentional weight loss, skin rash, eye pain, sinus congestion and sinus pain, sore throat, dysphagia,  hemoptysis , cough, dyspnea, wheezing, chest pain, palpitations, orthopnea, edema, abdominal pain, nausea, melena, diarrhea, constipation, flank pain, dysuria, hematuria, urinary  Frequency, nocturia, numbness, tingling, seizures,  Focal weakness, Loss of consciousness,  Tremor, insomnia, depression, anxiety, and suicidal ideation.     History   Social History  . Marital Status: Married    Spouse Name: N/A    Number of Children: N/A  . Years of Education: N/A   Occupational History  . Not on file.   Social History Main Topics  . Smoking status: Never Smoker   . Smokeless tobacco: Never Used  . Alcohol Use: No  . Drug Use: No  . Sexual Activity: Not on file   Other Topics Concern  . Not on file   Social History Narrative   Married.    Objective:  Filed Vitals:   08/05/13 0922  BP: 112/70  Pulse: 59  Temp: 98.3 F (36.8 C)  Resp: 16     General appearance: alert, cooperative and appears stated age Ears: normal TM's and external ear canals both ears Throat: lips, mucosa, and tongue normal; teeth and gums normal Neck: no adenopathy, no carotid bruit, supple, symmetrical,  trachea midline and thyroid not enlarged, symmetric, no tenderness/mass/nodules Back: symmetric, no curvature. ROM normal. No CVA tenderness. Lungs: clear to auscultation bilaterally Heart: regular rate and rhythm, S1, S2 normal, no murmur, click, rub or gallop Abdomen: soft, non-tender; bowel sounds normal; no masses,  no organomegaly Pulses: 2+ and symmetric Skin: Skin color, texture, turgor normal. No rashes or lesions Lymph nodes: Cervical, supraclavicular, and axillary nodes normal.  Assessment and Plan:  Obesity (BMI 30-39.9) She has had difficulty losing weight due to  increased appetite and is requesting a trial of  Phentermine.  She is aware of the possible side effects and risks and understands that    The medication will be discontinued if she has not lost 5% of her body weight over the next 3 months, which , based on today's weight is 10 lbs.  She has no signs of DM but low GI diet again recommended.   Lab Results  Component Value Date   HGBA1C 5.1 08/05/2013     Hypertension Well controlled on current regimen. Renal function stable, no changes today.  Lab Results  Component Value Date   CREATININE 0.9 08/05/2013    Lab Results  Component Value Date   NA 141 08/05/2013   K 4.4 08/05/2013   CL 103 08/05/2013   CO2 28 08/05/2013     Hyperlipidemia LDL goal < 100 Aggravated by weight gain. Will repeat in 3 months and if triglycerides are still > 300 will initiate trial of gemfibrozil   History of statin intolerance .  Lab Results  Component Value Date   CHOL 273* 08/05/2013   HDL 39.50 08/05/2013   LDLCALC 153* 08/05/2013   LDLDIRECT 173.5 12/03/2012   TRIG 403.0* 08/05/2013   CHOLHDL 7 08/05/2013    Updated Medication List Outpatient Encounter Prescriptions as of 08/05/2013  Medication Sig  . aspirin 81 MG EC tablet Take 1 tablet (81 mg total) by mouth daily. Swallow whole.  . fluticasone (FLONASE) 50 MCG/ACT nasal spray USE TWO SPRAY(S) IN EACH NOSTRIL ONCE DAILY  . losartan-hydrochlorothiazide (HYZAAR) 100-25 MG per tablet TAKE ONE TABLET BY MOUTH ONCE DAILY  . PARoxetine (PAXIL) 10 MG tablet Take 1 tablet (10 mg total) by mouth daily.  . [DISCONTINUED] PARoxetine (PAXIL) 10 MG tablet Take 1 tablet (10 mg total) by mouth daily.  . phentermine (ADIPEX-P) 37.5 MG tablet Take 0.5 tablets (18.75 mg total) by mouth 2 (two) times daily before a meal.

## 2013-08-05 NOTE — Progress Notes (Signed)
Pre-visit discussion using our clinic review tool. No additional management support is needed unless otherwise documented below in the visit note.  

## 2013-08-06 NOTE — Assessment & Plan Note (Signed)
Aggravated by weight gain. Will repeat in 3 months and if triglycerides are still > 300 will initiate trial of gemfibrozil   History of statin intolerance .  Lab Results  Component Value Date   CHOL 273* 08/05/2013   HDL 39.50 08/05/2013   LDLCALC 153* 08/05/2013   LDLDIRECT 173.5 12/03/2012   TRIG 403.0* 08/05/2013   CHOLHDL 7 08/05/2013

## 2013-08-06 NOTE — Assessment & Plan Note (Signed)
Well controlled on current regimen. Renal function stable, no changes today.  Lab Results  Component Value Date   CREATININE 0.9 08/05/2013    Lab Results  Component Value Date   NA 141 08/05/2013   K 4.4 08/05/2013   CL 103 08/05/2013   CO2 28 08/05/2013

## 2013-08-07 ENCOUNTER — Telehealth: Payer: Self-pay | Admitting: Internal Medicine

## 2013-08-07 ENCOUNTER — Encounter: Payer: Self-pay | Admitting: *Deleted

## 2013-08-07 NOTE — Telephone Encounter (Signed)
Patient notified

## 2013-08-07 NOTE — Telephone Encounter (Signed)
Pt asking for results of blood work °

## 2013-10-15 ENCOUNTER — Other Ambulatory Visit: Payer: Self-pay | Admitting: *Deleted

## 2013-10-15 MED ORDER — LOSARTAN POTASSIUM-HCTZ 100-25 MG PO TABS: ORAL_TABLET | ORAL | Status: DC

## 2013-11-04 ENCOUNTER — Encounter: Payer: Self-pay | Admitting: Internal Medicine

## 2013-11-04 ENCOUNTER — Ambulatory Visit (INDEPENDENT_AMBULATORY_CARE_PROVIDER_SITE_OTHER): Payer: Medicare Other | Admitting: Internal Medicine

## 2013-11-04 VITALS — BP 120/78 | HR 53 | Temp 97.8°F | Resp 16 | Ht 63.0 in | Wt 201.8 lb

## 2013-11-04 DIAGNOSIS — E669 Obesity, unspecified: Secondary | ICD-10-CM

## 2013-11-04 NOTE — Patient Instructions (Addendum)
Let's try one more month of phentermine   Take a full tablet daily in the morning  Exercise!!!! A minimum of 3 days a week.  Use your Fitness club   Goal is 10 lbs in 3 months (by October 20) or we stop the phentermine

## 2013-11-04 NOTE — Progress Notes (Signed)
Patient ID: Joanne Benton, female   DOB: 10-31-1945, 68 y.o.   MRN: [id number]  Patient Active Problem List   Diagnosis Date Noted  . Statin intolerance 01/03/2013  . Routine general medical examination at a health care facility 12/03/2012  . Nocturnal muscle cramps 12/03/2012  . Alopecia areata 06/25/2012  . Bradycardia 03/26/2012  . Hx of adenomatous colonic polyps   . Obesity (BMI 30-39.9) 04/14/2011  . Lumbago without sciatica 04/14/2011  . Hypertension 04/14/2011  . Hyperlipidemia LDL goal < 100 04/14/2011    Subjective:  CC:   Chief Complaint  Patient presents with  . Follow-up    3 month on weight loss    HPI:   Joanne Benton is a 68 y.o. female who presents for Follow up on obesity with phentermine trial.  She has not been able to drop  Below 198 lbs  in the last 3 months  She tried taking 1/2 tablet in the afternoon, which did not suppress her appetite,  Then tried taking a full tablet in the afternoon which kept her up all night.    She has not been  exercising much bc of the heat wave  (she likes to ride her bike)..  She is understandably frustrated,  And has struggled with her weight her entire life.  Diet reviewed and she follows a low glycemic index diet but does not count carbs or calories .  She has a membership to a gym but does not go,  However her husband goes daily.    Past Medical History  Diagnosis Date  . Obesity   . Hypertension   . Hyperlipidemia     with statin intolerance  . Knee pain   . Hx of adenomatous colonic polyps 2008    Sankar, repeat due 2013    Past Surgical History  Procedure Laterality Date  . Replacement total knee  August 2012    Right , [facility]  . Abdominal hysterectomy  1978    secondary to IUD complicaiton        The following portions of the patient's history were reviewed and updated as appropriate: Allergies, current medications, and problem list.    Review of Systems:   Patient denies headache, fevers,  malaise, unintentional weight loss, skin rash, eye pain, sinus congestion and sinus pain, sore throat, dysphagia,  hemoptysis , cough, dyspnea, wheezing, chest pain, palpitations, orthopnea, edema, abdominal pain, nausea, melena, diarrhea, constipation, flank pain, dysuria, hematuria, urinary  Frequency, nocturia, numbness, tingling, seizures,  Focal weakness, Loss of consciousness,  Tremor, insomnia, depression, anxiety, and suicidal ideation.     History   Social History  . Marital Status: Married    Spouse Name: N/A    Number of Children: N/A  . Years of Education: N/A   Occupational History  . Not on file.   Social History Main Topics  . Smoking status: Never Smoker   . Smokeless tobacco: Never Used  . Alcohol Use: No  . Drug Use: No  . Sexual Activity: Not on file   Other Topics Concern  . Not on file   Social History Narrative   Married.    Objective:  Filed Vitals:   11/04/13 0814  BP: 120/78  Pulse: 53  Temp: 97.8 F (36.6 C)  Resp: 16     General appearance: alert, cooperative and appears stated age  Neck: no adenopathy, no carotid bruit, supple, symmetrical, trachea midline and thyroid not enlarged, symmetric, no tenderness/mass/nodules Back: symmetric, no curvature. ROM  normal. No CVA tenderness. Lungs: clear to auscultation bilaterally Heart: regular rate and rhythm, S1, S2 normal, no murmur, click, rub or gallop Abdomen: soft, non-tender; bowel sounds normal; no masses,  no organomegaly Pulses: 2+ and symmetric  Assessment and Plan:  Obesity (BMI 30-39.9) Her BMI remains unchanged despite trial of phentermine.  We discussed the need for regular exercise being as vital as following a carbohydrate reduced diet with frequent small meals.  Also advised her to take full tablet of phentermine in the morning the nature and quality of her exercises well as of her diet. Return in 3 months with goal wt loss 10 lbs minimum to continue phentermine    Updated  Medication List Outpatient Encounter Prescriptions as of 11/04/2013  Medication Sig  . aspirin 81 MG EC tablet Take 1 tablet (81 mg total) by mouth daily. Swallow whole.  . fluticasone (FLONASE) 50 MCG/ACT nasal spray USE TWO SPRAY(S) IN EACH NOSTRIL ONCE DAILY  . losartan-hydrochlorothiazide (HYZAAR) 100-25 MG per tablet TAKE ONE TABLET BY MOUTH ONCE DAILY  . PARoxetine (PAXIL) 10 MG tablet Take 1 tablet (10 mg total) by mouth daily.  . [DISCONTINUED] phentermine (ADIPEX-P) 37.5 MG tablet Take 0.5 tablets (18.75 mg total) by mouth 2 (two) times daily before a meal.     No orders of the defined types were placed in this encounter.    Return in about 3 months (around 02/04/2014).

## 2013-11-04 NOTE — Progress Notes (Signed)
Pre-visit discussion using our clinic review tool. No additional management support is needed unless otherwise documented below in the visit note.  

## 2013-11-05 ENCOUNTER — Encounter: Payer: Self-pay | Admitting: Internal Medicine

## 2013-11-05 NOTE — Assessment & Plan Note (Addendum)
Her BMI remains unchanged despite trial of phentermine.  We discussed the need for regular exercise being as vital as following a carbohydrate reduced diet with frequent small meals.  Also advised her to take full tablet of phentermine in the morning the nature and quality of her exercises well as of her diet. Return in 3 months with goal wt loss 10 lbs minimum to continue phentermine

## 2013-11-12 LAB — HM MAMMOGRAPHY: HM Mammogram: NORMAL

## 2014-01-10 ENCOUNTER — Ambulatory Visit: Payer: Self-pay | Admitting: Internal Medicine

## 2014-01-14 ENCOUNTER — Telehealth: Payer: Self-pay | Admitting: Internal Medicine

## 2014-01-14 DIAGNOSIS — N6320 Unspecified lump in the left breast, unspecified quadrant: Secondary | ICD-10-CM

## 2014-01-14 DIAGNOSIS — N6310 Unspecified lump in the right breast, unspecified quadrant: Secondary | ICD-10-CM | POA: Insufficient documentation

## 2014-01-14 NOTE — Assessment & Plan Note (Signed)
Her mammogram was abnormal on the right. Joanne Benton will be calling her to set up a diagnostic mammogram and ultrasound .  I prefer to choose the surgeon with the patient if a biopsy is recommended, so if they tell her she needs a biopsy, she can tell them she is going to discuss it with me first.

## 2014-01-17 ENCOUNTER — Ambulatory Visit: Payer: Self-pay | Admitting: Internal Medicine

## 2014-01-17 LAB — HM MAMMOGRAPHY

## 2014-01-20 ENCOUNTER — Telehealth: Payer: Self-pay | Admitting: Internal Medicine

## 2014-01-20 DIAGNOSIS — N6310 Unspecified lump in the right breast, unspecified quadrant: Secondary | ICD-10-CM

## 2014-02-03 ENCOUNTER — Ambulatory Visit: Payer: Medicare Other | Admitting: Internal Medicine

## 2014-02-05 ENCOUNTER — Ambulatory Visit: Payer: Medicare Other | Admitting: Internal Medicine

## 2014-02-10 ENCOUNTER — Encounter: Payer: Self-pay | Admitting: Internal Medicine

## 2014-02-10 ENCOUNTER — Ambulatory Visit (INDEPENDENT_AMBULATORY_CARE_PROVIDER_SITE_OTHER): Payer: Medicare Other | Admitting: Internal Medicine

## 2014-02-10 VITALS — BP 110/72 | HR 54 | Temp 98.3°F | Resp 16 | Ht 63.0 in | Wt 184.5 lb

## 2014-02-10 DIAGNOSIS — N63 Unspecified lump in breast: Secondary | ICD-10-CM

## 2014-02-10 DIAGNOSIS — E669 Obesity, unspecified: Secondary | ICD-10-CM

## 2014-02-10 DIAGNOSIS — I10 Essential (primary) hypertension: Secondary | ICD-10-CM

## 2014-02-10 DIAGNOSIS — Z789 Other specified health status: Secondary | ICD-10-CM

## 2014-02-10 DIAGNOSIS — E785 Hyperlipidemia, unspecified: Secondary | ICD-10-CM

## 2014-02-10 DIAGNOSIS — Z23 Encounter for immunization: Secondary | ICD-10-CM

## 2014-02-10 DIAGNOSIS — R634 Abnormal weight loss: Secondary | ICD-10-CM

## 2014-02-10 DIAGNOSIS — N6310 Unspecified lump in the right breast, unspecified quadrant: Secondary | ICD-10-CM

## 2014-02-10 DIAGNOSIS — Z889 Allergy status to unspecified drugs, medicaments and biological substances status: Secondary | ICD-10-CM

## 2014-02-10 LAB — LIPID PANEL
CHOLESTEROL: 252 mg/dL — AB (ref 0–200)
HDL: 36.8 mg/dL — ABNORMAL LOW (ref >39.00)
NonHDL: 215.2
Total CHOL/HDL Ratio: 7
Triglycerides: 214 mg/dL — ABNORMAL HIGH (ref 0.0–149.0)
VLDL: 42.8 mg/dL — ABNORMAL HIGH (ref 0.0–40.0)

## 2014-02-10 LAB — COMPREHENSIVE METABOLIC PANEL
ALBUMIN: 3.6 g/dL (ref 3.5–5.2)
ALT: 25 U/L (ref 0–35)
AST: 23 U/L (ref 0–37)
Alkaline Phosphatase: 57 U/L (ref 39–117)
BUN: 22 mg/dL (ref 6–23)
CALCIUM: 9.4 mg/dL (ref 8.4–10.5)
CHLORIDE: 105 meq/L (ref 96–112)
CO2: 27 mEq/L (ref 19–32)
Creatinine, Ser: 0.9 mg/dL (ref 0.4–1.2)
GFR: 63.31 mL/min (ref >60.00)
GLUCOSE: 86 mg/dL (ref 70–99)
Potassium: 4.3 mEq/L (ref 3.5–5.1)
Sodium: 140 mEq/L (ref 135–145)
Total Bilirubin: 0.8 mg/dL (ref 0.2–1.2)
Total Protein: 6.7 g/dL (ref 6.0–8.3)

## 2014-02-10 LAB — LDL CHOLESTEROL, DIRECT: Direct LDL: 167 mg/dL

## 2014-02-10 LAB — MAGNESIUM: MAGNESIUM: 2.1 mg/dL (ref 1.5–2.5)

## 2014-02-10 MED ORDER — PHENTERMINE HCL 37.5 MG PO TABS: 37.5000 mg | ORAL_TABLET | Freq: Every day | ORAL | Status: DC

## 2014-02-10 NOTE — Patient Instructions (Signed)
You are doing well!1  I have authorized the use of phentermine for 3 more months.   and return to see me in 3 months.

## 2014-02-10 NOTE — Progress Notes (Signed)
Patient ID: Joanne Benton, female   DOB: 1946-04-01, 68 y.o.   MRN: [id number]  Patient Active Problem List   Diagnosis Date Noted  . Mass of right breast on mammogram 01/14/2014  . Statin intolerance 01/03/2013  . Routine general medical examination at a health care facility 12/03/2012  . Nocturnal muscle cramps 12/03/2012  . Alopecia areata 06/25/2012  . Bradycardia 03/26/2012  . Hx of adenomatous colonic polyps   . Obesity (BMI 30-39.9) 04/14/2011  . Lumbago without sciatica 04/14/2011  . Hypertension 04/14/2011  . Hyperlipidemia with target LDL less than 100 04/14/2011    Subjective:  CC:   Chief Complaint  Patient presents with  . Follow-up    weight patient down 17 pounds.  . Immunizations    Would like to know about pneumonia vaccine.    HPI:   Joanne Benton is a 68 y.o. female who presents for 3 month Follow up on chronic conditions including hypertension, abnormal mammogram, hyperlipidemia and obesity complicated by OA/DJD knees .   She has haad a WT LOSS OF 17 LBs intentionally  using 1/2 phentermine tablet daily.  She has been riding her bicycle 2 miles daily in 3rd gear or exercise. She is tolerating the medication without adverse side effects.   Knees feeling much better post surgery  Hooten has seen her for follow up and is plealsed with her range of motion.     Mammogram was  Repeated with additional views and was normal      Past Medical History  Diagnosis Date  . Obesity   . Hypertension   . Hyperlipidemia     with statin intolerance  . Knee pain   . Hx of adenomatous colonic polyps 2008    Sankar, repeat due 2013    Past Surgical History  Procedure Laterality Date  . Replacement total knee  August 2012    Right , Hooten  . Abdominal hysterectomy  1978    secondary to IUD complicaiton        The following portions of the patient's history were reviewed and updated as appropriate: Allergies, current medications, and problem  list.    Review of Systems:   Patient denies headache, fevers, malaise, unintentional weight loss, skin rash, eye pain, sinus congestion and sinus pain, sore throat, dysphagia,  hemoptysis , cough, dyspnea, wheezing, chest pain, palpitations, orthopnea, edema, abdominal pain, nausea, melena, diarrhea, constipation, flank pain, dysuria, hematuria, urinary  Frequency, nocturia, numbness, tingling, seizures,  Focal weakness, Loss of consciousness,  Tremor, insomnia, depression, anxiety, and suicidal ideation.     History   Social History  . Marital Status: Married    Spouse Name: N/A    Number of Children: N/A  . Years of Education: N/A   Occupational History  . Not on file.   Social History Main Topics  . Smoking status: Never Smoker   . Smokeless tobacco: Never Used  . Alcohol Use: No  . Drug Use: No  . Sexual Activity: Not on file   Other Topics Concern  . Not on file   Social History Narrative   Married.    Objective:  Filed Vitals:   02/10/14 0821  BP: 110/72  Pulse: 54  Temp: 98.3 F (36.8 C)  Resp: 16     General appearance: alert, cooperative and appears stated age Neck: no adenopathy, no carotid bruit, supple, symmetrical, trachea midline and thyroid not enlarged, symmetric, no tenderness/mass/nodules Back: symmetric, no curvature. ROM normal. No CVA tenderness. Lungs: clear  to auscultation bilaterally Heart: regular rate and rhythm, S1, S2 normal, no murmur, click, rub or gallop Abdomen: soft, non-tender; bowel sounds normal; no masses,  no organomegaly Pulses: 2+ and symmetric  Assessment and Plan:  Hypertension Well controlled on current regimen losartan hct   Renal function stable, no changes today.  Lab Results  Component Value Date   CREATININE 0.9 02/10/2014   Lab Results  Component Value Date   NA 140 02/10/2014   K 4.3 02/10/2014   CL 105 02/10/2014   CO2 27 02/10/2014     Obesity (BMI 30-39.9) .I have congratulated her in  reduction of   BMI and encouraged  Continued weight loss with goal of 5% of body weight over the next 3 months usingglycemic index diet and regular exercise a minimum of 5 days per week. Phentermine has  Been refilled more month s   Hyperlipidemia with target LDL less than 100 Improved reduction in triglycerides with diet and exercise. No indication for gemfbrozil. l   History of statin intolerance .  Lab Results  Component Value Date   CHOL 252* 02/10/2014   HDL 36.80* 02/10/2014   LDLCALC 153* 08/05/2013   LDLDIRECT 167.0 02/10/2014   TRIG 214.0* 02/10/2014   CHOLHDL 7 02/10/2014      Mass of right breast on mammogram Diagnostic imaging revealed the mass to be a cyst.  Will repeat screening in Sept 2016   Updated Medication List Outpatient Encounter Prescriptions as of 02/10/2014  Medication Sig  . aspirin 81 MG EC tablet Take 1 tablet (81 mg total) by mouth daily. Swallow whole.  . fluticasone (FLONASE) 50 MCG/ACT nasal spray USE TWO SPRAY(S) IN EACH NOSTRIL ONCE DAILY  . losartan-hydrochlorothiazide (HYZAAR) 100-25 MG per tablet TAKE ONE TABLET BY MOUTH ONCE DAILY  . PARoxetine (PAXIL) 10 MG tablet Take 1 tablet (10 mg total) by mouth daily.  . phentermine (ADIPEX-P) 37.5 MG tablet Take 1 tablet (37.5 mg total) by mouth daily before breakfast.     Orders Placed This Encounter  Procedures  . [facility] MAMMOGRAPHY  . Pneumococcal polysaccharide vaccine 23-valent greater than or equal to 2yo subcutaneous/IM  . Magnesium  . LDL cholesterol, direct    No Follow-up on file.

## 2014-02-10 NOTE — Progress Notes (Signed)
Pre-visit discussion using our clinic review tool. No additional management support is needed unless otherwise documented below in the visit note.  

## 2014-02-12 ENCOUNTER — Encounter: Payer: Self-pay | Admitting: *Deleted

## 2014-02-12 NOTE — Assessment & Plan Note (Signed)
.  I have congratulated her in reduction of   BMI and encouraged  Continued weight loss with goal of 5% of body weight over the next 3 months usingglycemic index diet and regular exercise a minimum of 5 days per week. Phentermine has  Been refilled more month s

## 2014-02-12 NOTE — Assessment & Plan Note (Signed)
Well controlled on current regimen losartan hct   Renal function stable, no changes today.  Lab Results  Component Value Date   CREATININE 0.9 02/10/2014   Lab Results  Component Value Date   NA 140 02/10/2014   K 4.3 02/10/2014   CL 105 02/10/2014   CO2 27 02/10/2014

## 2014-02-12 NOTE — Assessment & Plan Note (Signed)
Improved reduction in triglycerides with diet and exercise. No indication for gemfbrozil. l   History of statin intolerance .  Lab Results  Component Value Date   CHOL 252* 02/10/2014   HDL 36.80* 02/10/2014   LDLCALC 153* 08/05/2013   LDLDIRECT 167.0 02/10/2014   TRIG 214.0* 02/10/2014   CHOLHDL 7 02/10/2014

## 2014-02-12 NOTE — Assessment & Plan Note (Signed)
Diagnostic imaging revealed the mass to be a cyst.  Will repeat screening in Sept 2016

## 2014-03-03 ENCOUNTER — Encounter: Payer: Self-pay | Admitting: Internal Medicine

## 2014-03-07 ENCOUNTER — Encounter: Payer: Self-pay | Admitting: Internal Medicine

## 2014-03-25 ENCOUNTER — Telehealth: Payer: Self-pay | Admitting: *Deleted

## 2014-03-25 MED ORDER — BENZONATATE 200 MG PO CAPS: 200.0000 mg | ORAL_CAPSULE | Freq: Three times a day (TID) | ORAL | Status: DC | PRN

## 2014-03-25 MED ORDER — PREDNISONE (PAK) 10 MG PO TABS: ORAL_TABLET | ORAL | Status: DC

## 2014-03-25 NOTE — Telephone Encounter (Signed)
prednisone taper and tessalon perles sent to pharmacy

## 2014-03-25 NOTE — Telephone Encounter (Signed)
Pt called states her husband said he spoke with Dr Derrel Nip about pts cough during his OV on 12.7.15.  And Dr Derrel Nip said for pt to call in for Rx for cough.  Please advise

## 2014-03-25 NOTE — Telephone Encounter (Signed)
Spoke with pt, advised Rx sent. 

## 2014-03-31 ENCOUNTER — Ambulatory Visit (INDEPENDENT_AMBULATORY_CARE_PROVIDER_SITE_OTHER): Payer: Medicare Other | Admitting: Internal Medicine

## 2014-03-31 ENCOUNTER — Encounter: Payer: Self-pay | Admitting: Internal Medicine

## 2014-03-31 VITALS — BP 130/84 | HR 59 | Temp 98.3°F | Resp 16 | Ht 63.0 in | Wt 176.8 lb

## 2014-03-31 DIAGNOSIS — R059 Cough, unspecified: Secondary | ICD-10-CM

## 2014-03-31 DIAGNOSIS — R05 Cough: Secondary | ICD-10-CM

## 2014-03-31 DIAGNOSIS — E669 Obesity, unspecified: Secondary | ICD-10-CM

## 2014-03-31 DIAGNOSIS — J209 Acute bronchitis, unspecified: Secondary | ICD-10-CM

## 2014-03-31 MED ORDER — PHENTERMINE HCL 37.5 MG PO TABS: 37.5000 mg | ORAL_TABLET | Freq: Every day | ORAL | Status: DC

## 2014-03-31 MED ORDER — LEVOFLOXACIN 750 MG PO TABS: 750.0000 mg | ORAL_TABLET | Freq: Every day | ORAL | Status: DC

## 2014-03-31 MED ORDER — HYDROCOD POLST-CHLORPHEN POLST 10-8 MG/5ML PO LQCR: 5.0000 mL | Freq: Two times a day (BID) | ORAL | Status: DC | PRN

## 2014-03-31 MED ORDER — ALBUTEROL SULFATE HFA 108 (90 BASE) MCG/ACT IN AERS: 2.0000 | INHALATION_SPRAY | Freq: Four times a day (QID) | RESPIRATORY_TRACT | Status: DC | PRN

## 2014-03-31 NOTE — Patient Instructions (Signed)
I am prescribing you a 7 day course of levaquin  And albuterol inhaler if needed for wheezing/chest tightness (use every 6 hours if needed).  I am adding tussionex for the nighttime cough.

## 2014-03-31 NOTE — Progress Notes (Signed)
Patient ID: Joanne Benton, female   DOB: 1945-07-19, 68 y.o.   MRN: [id number]  Patient Active Problem List   Diagnosis Date Noted  . Acute bronchitis 04/01/2014  . Mass of right breast on mammogram 01/14/2014  . Statin intolerance 01/03/2013  . Routine general medical examination at a health care facility 12/03/2012  . Nocturnal muscle cramps 12/03/2012  . Alopecia areata 06/25/2012  . Bradycardia 03/26/2012  . Hx of adenomatous colonic polyps   . Obesity (BMI 30-39.9) 04/14/2011  . Lumbago without sciatica 04/14/2011  . Hypertension 04/14/2011  . Hyperlipidemia with target LDL less than 100 04/14/2011    Subjective:  CC:   Chief Complaint  Patient presents with  . Acute Visit    started with a cold 4 weeks ago and has been getting worse for 2 weeks .  Marland Kitchen Cough    yellow mucus real thick.    HPI:   Joanne Benton is a 68 y.o. female who presents for   Past Medical History  Diagnosis Date  . Obesity   . Hypertension   . Hyperlipidemia     with statin intolerance  . Knee pain   . Hx of adenomatous colonic polyps 2008    Sankar, repeat due 2013    Past Surgical History  Procedure Laterality Date  . Replacement total knee  August 2012    Right , [facility]en  . Abdominal hysterectomy  1978    secondary to IUD complicaiton        The following portions of the patient's history were reviewed and updated as appropriate: Allergies, current medications, and problem list.    Review of Systems:   Patient denies headache, fevers, malaise, unintentional weight loss, skin rash, eye pain, sinus congestion and sinus pain, sore throat, dysphagia,  hemoptysis , cough, dyspnea, wheezing, chest pain, palpitations, orthopnea, edema, abdominal pain, nausea, melena, diarrhea, constipation, flank pain, dysuria, hematuria, urinary  Frequency, nocturia, numbness, tingling, seizures,  Focal weakness, Loss of consciousness,  Tremor, insomnia, depression, anxiety, and suicidal ideation.      History   Social History  . Marital Status: Married    Spouse Name: N/A    Number of Children: N/A  . Years of Education: N/A   Occupational History  . Not on file.   Social History Main Topics  . Smoking status: Never Smoker   . Smokeless tobacco: Never Used  . Alcohol Use: No  . Drug Use: No  . Sexual Activity: Not on file   Other Topics Concern  . Not on file   Social History Narrative   Married.    Objective:  Filed Vitals:   03/31/14 1814  BP: 130/84  Pulse: 59  Temp: 98.3 F (36.8 C)  Resp: 16     General appearance: alert, cooperative and appears stated age Ears: normal TM's and external ear canals both ears Throat: lips, mucosa, and tongue normal; teeth and gums normal Neck: no adenopathy, no carotid bruit, supple, symmetrical, trachea midline and thyroid not enlarged, symmetric, no tenderness/mass/nodules Back: symmetric, no curvature. ROM normal. No CVA tenderness. Lungs: clear to auscultation bilaterally Heart: regular rate and rhythm, S1, S2 normal, no murmur, click, rub or gallop Abdomen: soft, non-tender; bowel sounds normal; no masses,  no organomegaly Pulses: 2+ and symmetric Skin: Skin color, texture, turgor normal. No rashes or lesions Lymph nodes: Cervical, supraclavicular, and axillary nodes normal.  Assessment and Plan:  Acute bronchitis Adding levaquin and albuterol MDI .  Chest x ray ordered to  rule out infiltrate  Obesity (BMI 30-39.9) I have congratulated her in reduction of   BMI using phentermine and encouraged  Continued weight loss over the next 6 months using a low glycemic index diet and regular exercise a minimum of 5 days per week.     Updated Medication List Outpatient Encounter Prescriptions as of 03/31/2014  Medication Sig  . aspirin 81 MG EC tablet Take 1 tablet (81 mg total) by mouth daily. Swallow whole.  . benzonatate (TESSALON) 200 MG capsule Take 1 capsule (200 mg total) by mouth 3 (three) times daily as  needed for cough.  . fluticasone (FLONASE) 50 MCG/ACT nasal spray USE TWO SPRAY(S) IN EACH NOSTRIL ONCE DAILY  . losartan-hydrochlorothiazide (HYZAAR) 100-25 MG per tablet TAKE ONE TABLET BY MOUTH ONCE DAILY  . PARoxetine (PAXIL) 10 MG tablet Take 1 tablet (10 mg total) by mouth daily.  . phentermine (ADIPEX-P) 37.5 MG tablet Take 1 tablet (37.5 mg total) by mouth daily before breakfast.  . [DISCONTINUED] phentermine (ADIPEX-P) 37.5 MG tablet Take 1 tablet (37.5 mg total) by mouth daily before breakfast.  . albuterol (PROVENTIL HFA;VENTOLIN HFA) 108 (90 BASE) MCG/ACT inhaler Inhale 2 puffs into the lungs every 6 (six) hours as needed for wheezing or shortness of breath.  . chlorpheniramine-HYDROcodone (TUSSIONEX) 10-8 MG/5ML LQCR Take 5 mLs by mouth every 12 (twelve) hours as needed for cough.  Marland Kitchen levofloxacin (LEVAQUIN) 750 MG tablet Take 1 tablet (750 mg total) by mouth daily.  . predniSONE (STERAPRED UNI-PAK) 10 MG tablet 6 tablets on Day 1 , then reduce by 1 tablet daily until gone (Patient not taking: Reported on 03/31/2014)     Orders Placed This Encounter  Procedures  . DG Chest 2 View    No Follow-up on file.

## 2014-03-31 NOTE — Progress Notes (Signed)
Pre-visit discussion using our clinic review tool. No additional management support is needed unless otherwise documented below in the visit note.  

## 2014-04-01 ENCOUNTER — Ambulatory Visit: Payer: Self-pay | Admitting: Internal Medicine

## 2014-04-01 DIAGNOSIS — J209 Acute bronchitis, unspecified: Secondary | ICD-10-CM | POA: Insufficient documentation

## 2014-04-01 NOTE — Assessment & Plan Note (Signed)
Adding levaquin.  Chest x ray ordered to rule out infiltrate

## 2014-04-01 NOTE — Assessment & Plan Note (Addendum)
I have congratulated her in reduction of   BMI using phentermine and encouraged  Continued weight loss over the next 6 months using a low glycemic index diet and regular exercise a minimum of 5 days per week.

## 2014-04-03 ENCOUNTER — Telehealth: Payer: Self-pay

## 2014-04-03 ENCOUNTER — Telehealth: Payer: Self-pay | Admitting: Internal Medicine

## 2014-04-03 NOTE — Telephone Encounter (Signed)
Spoke with pt, advised of result note

## 2014-04-03 NOTE — Telephone Encounter (Signed)
hest x ray was negative for acute changes /pneumonia

## 2014-04-03 NOTE — Telephone Encounter (Signed)
The patient called and is hoping to get her chest xray results.

## 2014-05-13 ENCOUNTER — Encounter: Payer: Self-pay | Admitting: Internal Medicine

## 2014-05-19 ENCOUNTER — Ambulatory Visit (INDEPENDENT_AMBULATORY_CARE_PROVIDER_SITE_OTHER): Payer: Medicare Other | Admitting: Internal Medicine

## 2014-05-19 ENCOUNTER — Encounter: Payer: Self-pay | Admitting: Internal Medicine

## 2014-05-19 VITALS — BP 118/76 | HR 64 | Temp 97.7°F | Resp 14 | Ht 63.0 in | Wt 170.8 lb

## 2014-05-19 DIAGNOSIS — E785 Hyperlipidemia, unspecified: Secondary | ICD-10-CM

## 2014-05-19 DIAGNOSIS — J209 Acute bronchitis, unspecified: Secondary | ICD-10-CM

## 2014-05-19 DIAGNOSIS — E669 Obesity, unspecified: Secondary | ICD-10-CM

## 2014-05-19 DIAGNOSIS — M545 Low back pain: Secondary | ICD-10-CM

## 2014-05-19 MED ORDER — PHENTERMINE HCL 37.5 MG PO TABS: 37.5000 mg | ORAL_TABLET | Freq: Every day | ORAL | Status: DC

## 2014-05-19 NOTE — Progress Notes (Signed)
Patient ID: Joanne Benton, female   DOB: [birth year], 69 y.o.   MRN: [id number]   Patient Active Problem List   Diagnosis Date Noted  . Acute bronchitis 04/01/2014  . Mass of right breast on mammogram 01/14/2014  . Statin intolerance 01/03/2013  . Routine general medical examination at a health care facility 12/03/2012  . Nocturnal muscle cramps 12/03/2012  . Alopecia areata 06/25/2012  . Bradycardia 03/26/2012  . Hx of adenomatous colonic polyps   . Obesity (BMI 30-39.9) 04/14/2011  . Lumbago without sciatica 04/14/2011  . Hypertension 04/14/2011  . Hyperlipidemia with target LDL less than 100 04/14/2011    Subjective:  CC:   Chief Complaint  Patient presents with  . Follow-up    weight loss    HPI:   Joanne Benton is a 69 y.o. female who presents for besity with continued use of appetite suppressant and regular exercise.  She has achieved significant weight loss with /2 TABLET DAILY OF PHENTERMINE .  EXERCISING LESS FREQUENTLY DUE TO HUSBAND's recent hip replacement , but she is planning to resume her gym participation 4 days/week and continue riding her 3 speed bicycle 2 DAYS PER WEEK.  She reports  NO SIDE EFFECTS FROM MEDICATION.  Her personal goal weight is 160 lbs   Wt Readings from Last 3 Encounters:  05/19/14 170 lb 12 oz (77.452 kg)  03/31/14 176 lb 12 oz (80.173 kg)  02/10/14 184 lb 8 oz (83.689 kg)    Still having a morning nonproductive cough despite the completion of a course of levaquin and prednisone ,  The cough has been present for  2 months. No nighttime or daytime cough.     Past Medical History  Diagnosis Date  . Obesity   . Hypertension   . Hyperlipidemia     with statin intolerance  . Knee pain   . Hx of adenomatous colonic polyps 2008    Sankar, repeat due 2013    Past Surgical History  Procedure Laterality Date  . Replacement total knee  August 2012    Right , [facility]ooten  . Abdominal hysterectomy  1978    secondary to IUD complicaiton         The following portions of the patient's history were reviewed and updated as appropriate: Allergies, current medications, and problem list.    Review of Systems:   Patient denies headache, fevers, malaise, unintentional weight loss, skin rash, eye pain, sinus congestion and sinus pain, sore throat, dysphagia,  hemoptysis , cough, dyspnea, wheezing, chest pain, palpitations, orthopnea, edema, abdominal pain, nausea, melena, diarrhea, constipation, flank pain, dysuria, hematuria, urinary  Frequency, nocturia, numbness, tingling, seizures,  Focal weakness, Loss of consciousness,  Tremor, insomnia, depression, anxiety, and suicidal ideation.     History   Social History  . Marital Status: Married    Spouse Name: N/A    Number of Children: N/A  . Years of Education: N/A   Occupational History  . Not on file.   Social History Main Topics  . Smoking status: Never Smoker   . Smokeless tobacco: Never Used  . Alcohol Use: No  . Drug Use: No  . Sexual Activity: Not on file   Other Topics Concern  . Not on file   Social History Narrative   Married.    Objective:  Filed Vitals:   05/19/14 0819  BP: 118/76  Pulse: 64  Temp: 97.7 F (36.5 C)  Resp: 14     General appearance: alert, cooperative and  appears stated age Ears: normal TM's and external ear canals both ears Throat: lips, mucosa, and tongue normal; teeth and gums normal Neck: no adenopathy, no carotid bruit, supple, symmetrical, trachea midline and thyroid not enlarged, symmetric, no tenderness/mass/nodules Back: symmetric, no curvature. ROM normal. No CVA tenderness. Lungs: clear to auscultation bilaterally Heart: regular rate and rhythm, S1, S2 normal, no murmur, click, rub or gallop Abdomen: soft, non-tender; bowel sounds normal; no masses,  no organomegaly Pulses: 2+ and symmetric Skin: Skin color, texture, turgor normal. No rashes or lesions Lymph nodes: Cervical, supraclavicular, and axillary nodes  normal.  Assessment and Plan:  Obesity (BMI 30-39.9) I have congratulated her in reduction of   BMI and encouraged  Continued weight loss with goal of 10 more lbs over the next e months using a low glycemic index diet ,phentermine and regular exercise a minimum of 5 days per week. Phentermine has been refilled for 3 months.     Lumbago without sciatica Improving with weight loss.    Acute bronchitis Largely resolved,  Continue saline irrigation of sinuses.     Updated Medication List Outpatient Encounter Prescriptions as of 05/19/2014  Medication Sig  . albuterol (PROVENTIL HFA;VENTOLIN HFA) 108 (90 BASE) MCG/ACT inhaler Inhale 2 puffs into the lungs every 6 (six) hours as needed for wheezing or shortness of breath.  Marland Kitchen aspirin 81 MG EC tablet Take 1 tablet (81 mg total) by mouth daily. Swallow whole.  . fluticasone (FLONASE) 50 MCG/ACT nasal spray USE TWO SPRAY(S) IN EACH NOSTRIL ONCE DAILY  . losartan-hydrochlorothiazide (HYZAAR) 100-25 MG per tablet TAKE ONE TABLET BY MOUTH ONCE DAILY  . PARoxetine (PAXIL) 10 MG tablet Take 1 tablet (10 mg total) by mouth daily.  . phentermine (ADIPEX-P) 37.5 MG tablet Take 1 tablet (37.5 mg total) by mouth daily before breakfast.  . [DISCONTINUED] phentermine (ADIPEX-P) 37.5 MG tablet Take 1 tablet (37.5 mg total) by mouth daily before breakfast.  . [DISCONTINUED] benzonatate (TESSALON) 200 MG capsule Take 1 capsule (200 mg total) by mouth 3 (three) times daily as needed for cough. (Patient not taking: Reported on 05/19/2014)  . [DISCONTINUED] chlorpheniramine-HYDROcodone (TUSSIONEX) 10-8 MG/5ML LQCR Take 5 mLs by mouth every 12 (twelve) hours as needed for cough. (Patient not taking: Reported on 05/19/2014)  . [DISCONTINUED] levofloxacin (LEVAQUIN) 750 MG tablet Take 1 tablet (750 mg total) by mouth daily. (Patient not taking: Reported on 05/19/2014)  . [DISCONTINUED] predniSONE (STERAPRED UNI-PAK) 10 MG tablet 6 tablets on Day 1 , then reduce by 1 tablet  daily until gone (Patient not taking: Reported on 03/31/2014)     Orders Placed This Encounter  Procedures  . Comprehensive metabolic panel  . TSH  . Lipid panel    Return in about 3 months (around 08/17/2014).

## 2014-05-19 NOTE — Progress Notes (Signed)
Pre-visit discussion using our clinic review tool. No additional management support is needed unless otherwise documented below in the visit note.  

## 2014-05-19 NOTE — Patient Instructions (Addendum)
You are doing great! Your goal is 160 lbs and 5 days of exercise weekly to keep the weight off

## 2014-05-20 ENCOUNTER — Encounter: Payer: Self-pay | Admitting: Internal Medicine

## 2014-05-20 NOTE — Assessment & Plan Note (Signed)
Improving with weight loss.

## 2014-05-20 NOTE — Assessment & Plan Note (Signed)
Largely resolved,  Continue saline irrigation of sinuses.

## 2014-05-20 NOTE — Assessment & Plan Note (Signed)
I have congratulated her in reduction of   BMI and encouraged  Continued weight loss with goal of 10 more lbs over the next e months using a low glycemic index diet ,phentermine and regular exercise a minimum of 5 days per week. Phentermine has been refilled for 3 months.

## 2014-08-08 NOTE — Op Note (Signed)
PATIENT NAME:  Joanne Benton, Joanne Benton MR#:  [id number] DATE OF BIRTH:  [birth year]  DATE OF PROCEDURE:  02/08/2013  PREOPERATIVE DIAGNOSIS:  Internal derangement of the left knee.   POSTOPERATIVE DIAGNOSES: 1.  Tear of the posterior horn of medial meniscus, left knee.  2.  Tear of the posterior horn of lateral meniscus, left knee.  3.  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   PROCEDURES PERFORMED:  Knee arthroscopy, partial medial and lateral meniscectomies and lateral chondroplasty.   SURGEON:  Skip Estimable, M.D.   ANESTHESIA:  General.   ESTIMATED BLOOD LOSS:  Minimal.   FLUIDS REPLACED:  700 mL of crystalloid.   DRAINS:  None.   INDICATIONS FOR SURGERY:  The patient is a 69 year old female who has been seen for complaints of persistent left knee pain.  MRI demonstrated findings consistent with meniscal pathology as well as some chondral changes.  After discussion of the risks and benefits of surgical intervention, the patient expressed understanding of the risks and benefits and agreed with plans for surgical intervention.   PROCEDURE IN DETAIL:  The patient was brought into the Operating Room and, after adequate general anesthesia was achieved, a tourniquet was placed on the patient's left thigh and leg was placed in a leg holder.  All bony prominences were well-padded.  The patient's left knee and leg were cleaned and prepped with alcohol and DuraPrep and draped in the usual sterile fashion.  A "timeout" was performed as per usual protocol.  The anticipated portal sites were injected with 0.25% Marcaine with epinephrine.  An anterolateral portal was created and a cannula was inserted.  A small effusion was evacuated.  The scope was inserted and the knee was distended with fluid using the Stryker pump.  The scope was advanced down the medial gutter into the medial compartment of the knee.  Under visualization with the scope, an anteromedial portal was created and hook probe was  inserted.  Inspection of the medial compartment demonstrated a degenerative tear involving the posterior horn of the medial meniscus.  The tear was debrided using meniscal punches and a 4.5 mm shaver.  Final contouring was performed using a 50 degree ArthroCare wand.  The anterior horn was visualized and probed and found to be stable.  Only mild chondral changes were noted to the medial compartment.  The scope was then advanced into the intracondylar region.  The anterior cruciate ligament was visualized and probed and felt to stable.  The scope was removed from the anterolateral portal and reinserted via the anteromedial portal so as to better visualize the lateral compartment.  Degenerative tear involving the posterior horn was debrided using meniscal punches and a 4.5 mm shaver.  Final contouring of the transition zone as well as some fraying along the inner aspect of the lateral meniscus was performed using the 50 degree ArthroCare wand.  The remaining rim of meniscus was visualized and probed and felt to stable.  There were some changes of grade III chondromalacia involving the lateral compartment.  These were debrided using the ArthroCare wand.  Finally, the scope was positioned so as to visualize the patellofemoral articulation.  Reasonably good patellar tracking was noted.  Some mild chondral changes were noted.  These were debrided using the ArthroCare wand.  The knee was irrigated with copious amounts of fluid and then suctioned dry.  The anterolateral portal was reapproximated using #3-0 nylon.  A combination of 0.25% Marcaine with epinephrine and 4 mg of morphine was injected via  the scope.  The scope was removed and the anteromedial portal was reapproximated using 3-0 nylon.  A sterile dressing was applied followed by application of an ice wrap.    The patient tolerated the procedure well.  She was transported to the recovery room in stable condition.     ____________________________ Laurice Record.  Holley Bouche., MD jph:ea D: 02/08/2013 23:11:14 ET T: 02/09/2013 00:04:11 ET JOB#: [id number]  cc: Laurice Record. Holley Bouche., MD, <Dictator> Laurice Record Holley Bouche MD ELECTRONICALLY SIGNED 02/09/2013 11:42

## 2014-08-18 ENCOUNTER — Other Ambulatory Visit (INDEPENDENT_AMBULATORY_CARE_PROVIDER_SITE_OTHER): Payer: Medicare Other

## 2014-08-18 ENCOUNTER — Ambulatory Visit: Payer: Medicare Other

## 2014-08-18 DIAGNOSIS — E669 Obesity, unspecified: Secondary | ICD-10-CM

## 2014-08-18 DIAGNOSIS — E785 Hyperlipidemia, unspecified: Secondary | ICD-10-CM | POA: Diagnosis not present

## 2014-08-18 LAB — COMPREHENSIVE METABOLIC PANEL
ALK PHOS: 57 U/L (ref 39–117)
ALT: 18 U/L (ref 0–35)
AST: 19 U/L (ref 0–37)
Albumin: 3.8 g/dL (ref 3.5–5.2)
BUN: 21 mg/dL (ref 6–23)
CO2: 28 mEq/L (ref 19–32)
CREATININE: 0.96 mg/dL (ref 0.40–1.20)
Calcium: 9.4 mg/dL (ref 8.4–10.5)
Chloride: 104 mEq/L (ref 96–112)
GFR: 61.69 mL/min (ref >60.00)
Glucose, Bld: 98 mg/dL (ref 70–99)
POTASSIUM: 3.9 meq/L (ref 3.5–5.1)
Sodium: 139 mEq/L (ref 135–145)
Total Bilirubin: 0.4 mg/dL (ref 0.2–1.2)
Total Protein: 6.7 g/dL (ref 6.0–8.3)

## 2014-08-18 LAB — LIPID PANEL
CHOLESTEROL: 240 mg/dL — AB (ref 0–200)
HDL: 38.2 mg/dL — ABNORMAL LOW (ref >39.00)
LDL Cholesterol: 175 mg/dL — ABNORMAL HIGH (ref 0–99)
NONHDL: 201.8
Total CHOL/HDL Ratio: 6
Triglycerides: 135 mg/dL (ref 0.0–149.0)
VLDL: 27 mg/dL (ref 0.0–40.0)

## 2014-08-18 LAB — TSH: TSH: 1 u[IU]/mL (ref 0.35–4.50)

## 2014-08-21 ENCOUNTER — Ambulatory Visit: Payer: Medicare Other | Admitting: Internal Medicine

## 2014-08-22 ENCOUNTER — Ambulatory Visit (INDEPENDENT_AMBULATORY_CARE_PROVIDER_SITE_OTHER): Payer: Medicare Other | Admitting: Internal Medicine

## 2014-08-22 ENCOUNTER — Encounter: Payer: Self-pay | Admitting: Internal Medicine

## 2014-08-22 VITALS — BP 106/64 | HR 64 | Temp 97.8°F | Resp 14 | Ht 63.0 in | Wt 162.5 lb

## 2014-08-22 DIAGNOSIS — I10 Essential (primary) hypertension: Secondary | ICD-10-CM

## 2014-08-22 DIAGNOSIS — E669 Obesity, unspecified: Secondary | ICD-10-CM

## 2014-08-22 MED ORDER — PAROXETINE HCL 10 MG PO TABS: 10.0000 mg | ORAL_TABLET | Freq: Every day | ORAL | Status: DC

## 2014-08-22 MED ORDER — LOSARTAN POTASSIUM-HCTZ 50-12.5 MG PO TABS: 1.0000 | ORAL_TABLET | Freq: Every day | ORAL | Status: DC

## 2014-08-22 NOTE — Progress Notes (Signed)
Patient ID: Joanne Benton, female   DOB: [birth year], 70 y.o.   MRN: [id number]   Patient Active Problem List   Diagnosis Date Noted  . Mass of right breast on mammogram 01/14/2014  . Statin intolerance 01/03/2013  . Routine general medical examination at a health care facility 12/03/2012  . Nocturnal muscle cramps 12/03/2012  . Alopecia areata 06/25/2012  . Bradycardia 03/26/2012  . Hx of adenomatous colonic polyps   . Obesity (BMI 30-39.9) 04/14/2011  . Lumbago without sciatica 04/14/2011  . Hypertension 04/14/2011  . Hyperlipidemia with target LDL less than 100 04/14/2011    Subjective:  CC:   Chief Complaint  Patient presents with  . Follow-up    weight    HPI:   Joanne Benton is a 69 y.o. female who presents for   FOLLOW UP ON OBESITY AND USE OF PHENTERMINE.   SHE IS TOLERATING THE MEDICATION  AT HALF THE MAXIMUM DOSE AND EXERCISING DAILY .  She has been losing weight .   SHE HAS HAD  SEVERAL EPISODES OF DIZZINESS,  Which have been occurring during her workouts.  She denies rapid hear rate,  Chest pain, and shortness of breath.  BP TODAY IS SYSTOLIC IS 762,     Past Medical History  Diagnosis Date  . Obesity   . Hypertension   . Hyperlipidemia     with statin intolerance  . Knee pain   . Hx of adenomatous colonic polyps 2008    Sankar, repeat due 2013    Past Surgical History  Procedure Laterality Date  . Replacement total knee  August 2012    Right , [place]  . Abdominal hysterectomy  1978    secondary to IUD complicaiton        The following portions of the patient's history were reviewed and updated as appropriate: Allergies, current medications, and problem list.    Review of Systems:   Patient denies headache, fevers, malaise, unintentional weight loss, skin rash, eye pain, sinus congestion and sinus pain, sore throat, dysphagia,  hemoptysis , cough, dyspnea, wheezing, chest pain, palpitations, orthopnea, edema, abdominal pain, nausea, melena,  diarrhea, constipation, flank pain, dysuria, hematuria, urinary  Frequency, nocturia, numbness, tingling, seizures,  Focal weakness, Loss of consciousness,  Tremor, insomnia, depression, anxiety, and suicidal ideation.     History   Social History  . Marital Status: Married    Spouse Name: N/A  . Number of Children: N/A  . Years of Education: N/A   Occupational History  . Not on file.   Social History Main Topics  . Smoking status: Never Smoker   . Smokeless tobacco: Never Used  . Alcohol Use: No  . Drug Use: No  . Sexual Activity: Not on file   Other Topics Concern  . Not on file   Social History Narrative   Married.    Objective:  Filed Vitals:   08/22/14 0810  BP: 106/64  Pulse: 64  Temp: 97.8 F (36.6 C)  Resp: 14     General appearance: alert, cooperative and appears stated age Ears: normal TM's and external ear canals both ears Throat: lips, mucosa, and tongue normal; teeth and gums normal Neck: no adenopathy, no carotid bruit, supple, symmetrical, trachea midline and thyroid not enlarged, symmetric, no tenderness/mass/nodules Back: symmetric, no curvature. ROM normal. No CVA tenderness. Lungs: clear to auscultation bilaterally Heart: regular rate and rhythm, S1, S2 normal, no murmur, click, rub or gallop Abdomen: soft, non-tender; bowel sounds normal; no masses,  no organomegaly Pulses: 2+ and symmetric Skin: Skin color, texture, turgor normal. No rashes or lesions Lymph nodes: Cervical, supraclavicular, and axillary nodes normal.  Assessment and Plan:  Hypertension Improved control with weight loss so significant that she is having hypotension.  Reducing losartan dose today    Obesity (BMI 30-39.9) I have congratulated her in reduction of   BMI . She has lost 14 lb siince December using phentermine for appetite suppression , a low GI diet, and regular exercise.  I have authorized the use of phentermine for another month for daily use, followed by  every other day use.   Wt Readings from Last 3 Encounters:  08/22/14 162 lb 8 oz (73.71 kg)  05/19/14 170 lb 12 oz (77.452 kg)  03/31/14 176 lb 12 oz (80.173 kg)     A total of 25 minutes of face to face time was spent with patient more than half of which was spent in counselling and coordination of care   Updated Medication List Outpatient Encounter Prescriptions as of 08/22/2014  Medication Sig  . aspirin 81 MG EC tablet Take 1 tablet (81 mg total) by mouth daily. Swallow whole.  . fluticasone (FLONASE) 50 MCG/ACT nasal spray USE TWO SPRAY(S) IN EACH NOSTRIL ONCE DAILY  . PARoxetine (PAXIL) 10 MG tablet Take 1 tablet (10 mg total) by mouth daily.  . phentermine (ADIPEX-P) 37.5 MG tablet Take 1 tablet (37.5 mg total) by mouth daily before breakfast. (Patient taking differently: Take 37.5 mg by mouth daily before breakfast. Patient take 0.5 tablet per day)  . [DISCONTINUED] losartan-hydrochlorothiazide (HYZAAR) 100-25 MG per tablet TAKE ONE TABLET BY MOUTH ONCE DAILY  . [DISCONTINUED] PARoxetine (PAXIL) 10 MG tablet Take 1 tablet (10 mg total) by mouth daily.  Marland Kitchen albuterol (PROVENTIL HFA;VENTOLIN HFA) 108 (90 BASE) MCG/ACT inhaler Inhale 2 puffs into the lungs every 6 (six) hours as needed for wheezing or shortness of breath. (Patient not taking: Reported on 08/22/2014)  . losartan-hydrochlorothiazide (HYZAAR) 50-12.5 MG per tablet Take 1 tablet by mouth daily.   No facility-administered encounter medications on file as of 08/22/2014.     No orders of the defined types were placed in this encounter.    Return in about 3 months (around 11/22/2014).

## 2014-08-22 NOTE — Patient Instructions (Signed)
Congratulations!!!!  Your are almost at your goal.  I would continue the phentermine for  one more month on a daily basis, then switch to every other day or as needed.  Your episodes at the gym are likely due to hypotension.  STOP YOUR LOSARTAN.  I have sent a lower dose  Of  losartan /hctz to half of your current dose.  Start ONLY  if your untreated pressure is over 140/80   Consider drinking 8 ounces of almond or cashew milk every day  for the calcium they both have,   as a substitution for milk and supplements.   Premier Protein  Is another great tasting low carb high protein shake availabel in vanilla, chocolate and strawberrry.    Please take a probiotic ( Align, Floraque or Culturelle) on a daily basis  For bowel regularity or at least or 2 weeks if you start an antibiotic to prevent a serious antibiotic associated diarrhea  Called" clostridium dificile colitis" ( should also help prevent   vaginal yeast infection)

## 2014-08-23 ENCOUNTER — Encounter: Payer: Self-pay | Admitting: Internal Medicine

## 2014-08-23 NOTE — Assessment & Plan Note (Addendum)
Improved control with weight loss so significant that she is having hypotension.  Reducing losartan dose today

## 2014-08-23 NOTE — Assessment & Plan Note (Signed)
I have congratulated her in reduction of   BMI . She has lost 14 lb siince December using phentermine for appetite suppression , a low GI diet, and regular exercise.  I have authorized the use of phentermine for another month for daily use, followed by every other day use.   Wt Readings from Last 3 Encounters:  08/22/14 162 lb 8 oz (73.71 kg)  05/19/14 170 lb 12 oz (77.452 kg)  03/31/14 176 lb 12 oz (80.173 kg)

## 2014-08-25 ENCOUNTER — Telehealth: Payer: Self-pay | Admitting: Internal Medicine

## 2014-08-25 MED ORDER — HYDROCHLOROTHIAZIDE 25 MG PO TABS: 25.0000 mg | ORAL_TABLET | Freq: Every day | ORAL | Status: DC

## 2014-08-25 NOTE — Telephone Encounter (Signed)
Tell her to  Refuse the new medicaiton,  i will send hctz alone 25 mg . To start daily instead . Cath can you call pharmacy and dc the losartan hct sent in last Friday?

## 2014-08-25 NOTE — Telephone Encounter (Signed)
Patient notified not to pick up Losartan HCTZ  combination and that regular HCTZ sent to pharmacy wrong pharmacy resent HCTZ to [facility] as requested. Patient aware to pick up HCTZ .

## 2014-08-25 NOTE — Telephone Encounter (Signed)
Patient called and stated she has gained 3.5 pounds over the weekend and her BP 136/78 this morning heart rate 85 patient stated swelling in hands bilateral and she feels tight all over , patient stopped the Losartan/ HCTZ as directed and has not picked up lower dose sent to pharmacy due to BP has not been up to 140/80. Please advise.

## 2014-09-20 ENCOUNTER — Other Ambulatory Visit: Payer: Self-pay | Admitting: Internal Medicine

## 2014-11-24 ENCOUNTER — Encounter: Payer: Self-pay | Admitting: Internal Medicine

## 2014-11-24 ENCOUNTER — Ambulatory Visit (INDEPENDENT_AMBULATORY_CARE_PROVIDER_SITE_OTHER): Payer: Medicare Other | Admitting: Internal Medicine

## 2014-11-24 VITALS — BP 126/82 | HR 60 | Temp 97.6°F | Resp 14 | Ht 63.0 in | Wt 160.8 lb

## 2014-11-24 DIAGNOSIS — E663 Overweight: Secondary | ICD-10-CM

## 2014-11-24 DIAGNOSIS — Z1239 Encounter for other screening for malignant neoplasm of breast: Secondary | ICD-10-CM

## 2014-11-24 DIAGNOSIS — E785 Hyperlipidemia, unspecified: Secondary | ICD-10-CM

## 2014-11-24 DIAGNOSIS — I10 Essential (primary) hypertension: Secondary | ICD-10-CM | POA: Diagnosis not present

## 2014-11-24 LAB — LIPID PANEL
CHOL/HDL RATIO: 6
Cholesterol: 298 mg/dL — ABNORMAL HIGH (ref 0–200)
HDL: 50.3 mg/dL (ref >39.00)
LDL Cholesterol: 219 mg/dL — ABNORMAL HIGH (ref 0–99)
NONHDL: 247.61
Triglycerides: 141 mg/dL (ref 0.0–149.0)
VLDL: 28.2 mg/dL (ref 0.0–40.0)

## 2014-11-24 LAB — BASIC METABOLIC PANEL
BUN: 19 mg/dL (ref 6–23)
CALCIUM: 9.8 mg/dL (ref 8.4–10.5)
CO2: 30 meq/L (ref 19–32)
CREATININE: 0.92 mg/dL (ref 0.40–1.20)
Chloride: 104 mEq/L (ref 96–112)
GFR: 64.74 mL/min (ref >60.00)
Glucose, Bld: 98 mg/dL (ref 70–99)
POTASSIUM: 4 meq/L (ref 3.5–5.1)
SODIUM: 143 meq/L (ref 135–145)

## 2014-11-24 NOTE — Progress Notes (Signed)
Pre visit review using our clinic review tool, if applicable. No additional management support is needed unless otherwise documented below in the visit note. 

## 2014-11-24 NOTE — Progress Notes (Signed)
Subjective:  Patient ID: Joanne Benton, female    DOB: Oct 30, 1945  Age: 69 y.o. MRN: [id number]  CC: The primary encounter diagnosis was Lipidemia. Diagnoses of Hyperlipidemia, Essential hypertension, Breast cancer screening, Overweight (BMI 25.0-29.9), and Hyperlipidemia with target LDL less than 100 were also pertinent to this visit.  HPI Joanne Benton presents for follow up on hypertension,  Obesity and GAD.  She has achieved a significant reduction in BMI and stopped phentermine in June.  Since then her weight has dropped to 157 but she has been unable to keep it there despite daily exercise and low GI diet.     Outpatient Prescriptions Prior to Visit  Medication Sig Dispense Refill  . fluticasone (FLONASE) 50 MCG/ACT nasal spray USE TWO SPRAY(S) IN EACH NOSTRIL ONCE DAILY 16 g 5  . hydrochlorothiazide (HYDRODIURIL) 25 MG tablet Take 1 tablet (25 mg total) by mouth daily. 90 tablet 3  . PARoxetine (PAXIL) 10 MG tablet Take 1 tablet (10 mg total) by mouth daily. 90 tablet 3  . aspirin 81 MG EC tablet Take 1 tablet (81 mg total) by mouth daily. Swallow whole. (Patient not taking: Reported on 11/24/2014) 30 tablet 12  . albuterol (PROVENTIL HFA;VENTOLIN HFA) 108 (90 BASE) MCG/ACT inhaler Inhale 2 puffs into the lungs every 6 (six) hours as needed for wheezing or shortness of breath. (Patient not taking: Reported on 08/22/2014) 1 Inhaler 0  . PARoxetine (PAXIL) 10 MG tablet TAKE ONE TABLET BY MOUTH ONCE DAILY 90 tablet 0  . phentermine (ADIPEX-P) 37.5 MG tablet Take 1 tablet (37.5 mg total) by mouth daily before breakfast. (Patient not taking: Reported on 11/24/2014) 30 tablet 2   No facility-administered medications prior to visit.    Review of Systems;  Patient denies headache, fevers, malaise, unintentional weight loss, skin rash, eye pain, sinus congestion and sinus pain, sore throat, dysphagia,  hemoptysis , cough, dyspnea, wheezing, chest pain, palpitations, orthopnea, edema, abdominal pain,  nausea, melena, diarrhea, constipation, flank pain, dysuria, hematuria, urinary  Frequency, nocturia, numbness, tingling, seizures,  Focal weakness, Loss of consciousness,  Tremor, insomnia, depression, anxiety, and suicidal ideation.      Objective:  BP 126/82 mmHg  Pulse 60  Temp(Src) 97.6 F (36.4 C) (Oral)  Resp 14  Ht 5\' 3"  (1.6 m)  Wt 160 lb 12.8 oz (72.938 kg)  BMI 28.49 kg/m2  SpO2 99%  BP Readings from Last 3 Encounters:  11/24/14 126/82  08/22/14 106/64  05/19/14 118/76    Wt Readings from Last 3 Encounters:  11/24/14 160 lb 12.8 oz (72.938 kg)  08/22/14 162 lb 8 oz (73.71 kg)  05/19/14 170 lb 12 oz (77.452 kg)    General appearance: alert, cooperative and appears stated age Ears: normal TM's and external ear canals both ears Throat: lips, mucosa, and tongue normal; teeth and gums normal Neck: no adenopathy, no carotid bruit, supple, symmetrical, trachea midline and thyroid not enlarged, symmetric, no tenderness/mass/nodules Back: symmetric, no curvature. ROM normal. No CVA tenderness. Lungs: clear to auscultation bilaterally Heart: regular rate and rhythm, S1, S2 normal, no murmur, click, rub or gallop Abdomen: soft, non-tender; bowel sounds normal; no masses,  no organomegaly Pulses: 2+ and symmetric Skin: Skin color, texture, turgor normal. No rashes or lesions Lymph nodes: Cervical, supraclavicular, and axillary nodes normal.  Lab Results  Component Value Date   HGBA1C 5.1 08/05/2013    Lab Results  Component Value Date   CREATININE 0.92 11/24/2014   CREATININE 0.96 08/18/2014   CREATININE  0.9 02/10/2014    Lab Results  Component Value Date   WBC 5.5 12/03/2012   HGB 13.4 12/03/2012   HCT 38.8 12/03/2012   PLT 252.0 12/03/2012   GLUCOSE 98 11/24/2014   CHOL 298* 11/24/2014   TRIG 141.0 11/24/2014   HDL 50.30 11/24/2014   LDLDIRECT 167.0 02/10/2014   LDLCALC 219* 11/24/2014   ALT 18 08/18/2014   AST 19 08/18/2014   NA 143 11/24/2014   K  4.0 11/24/2014   CL 104 11/24/2014   CREATININE 0.92 11/24/2014   BUN 19 11/24/2014   CO2 30 11/24/2014   TSH 1.00 08/18/2014   HGBA1C 5.1 08/05/2013    No results found.  Assessment & Plan:   Problem List Items Addressed This Visit      Unprioritized   Overweight (BMI 25.0-29.9)    I have congratulated her in reduction of   BMI and encouraged  Continued weight loss with goal of 10% of body weigh over the next 6 months using a low fat, low glycemic index diet and regular exercise a minimum of 5 days per week.        Hypertension    Well controlled on current regimen. Renal function stable, no changes today.  Lab Results  Component Value Date   CREATININE 0.92 11/24/2014   Lab Results  Component Value Date   NA 143 11/24/2014   K 4.0 11/24/2014   CL 104 11/24/2014   CO2 30 11/24/2014         Relevant Orders   Basic metabolic panel (Completed)   Hyperlipidemia with target LDL less than 100    Improved reduction in triglycerides with diet and exercise. No indication for gemfbrozil. l   History of statin intolerance .  Lab Results  Component Value Date   CHOL 298* 11/24/2014   HDL 50.30 11/24/2014   LDLCALC 219* 11/24/2014   LDLDIRECT 167.0 02/10/2014   TRIG 141.0 11/24/2014   CHOLHDL 6 11/24/2014             Other Visit Diagnoses    Lipidemia    -  Primary    Hyperlipidemia        Relevant Orders    Lipid panel (Completed)    Breast cancer screening        Relevant Orders    MM DIGITAL SCREENING BILATERAL      A total of 25 minutes of face to face time was spent with patient more than half of which was spent in counselling about the above mentioned conditions  and coordination of care  I have discontinued Ms. Dilone's albuterol and phentermine. I am also having her maintain her aspirin, fluticasone, PARoxetine, and hydrochlorothiazide.  No orders of the defined types were placed in this encounter.    Medications Discontinued During This Encounter    Medication Reason  . albuterol (PROVENTIL HFA;VENTOLIN HFA) 108 (90 BASE) MCG/ACT inhaler Completed Course  . PARoxetine (PAXIL) 10 MG tablet Duplicate  . phentermine (ADIPEX-P) 37.5 MG tablet Completed Course    Follow-up: Return in about 6 months (around 05/27/2015).   Crecencio Mc, MD

## 2014-11-24 NOTE — Assessment & Plan Note (Signed)
Well controlled on current regimen. Renal function stable, no changes today.  Lab Results  Component Value Date   CREATININE 0.92 11/24/2014   Lab Results  Component Value Date   NA 143 11/24/2014   K 4.0 11/24/2014   CL 104 11/24/2014   CO2 30 11/24/2014

## 2014-11-24 NOTE — Assessment & Plan Note (Signed)
Improved reduction in triglycerides with diet and exercise. No indication for gemfbrozil. l   History of statin intolerance .  Lab Results  Component Value Date   CHOL 298* 11/24/2014   HDL 50.30 11/24/2014   LDLCALC 219* 11/24/2014   LDLDIRECT 167.0 02/10/2014   TRIG 141.0 11/24/2014   CHOLHDL 6 11/24/2014

## 2014-11-24 NOTE — Patient Instructions (Addendum)
You have done amazingly well!      The  diet I discussed with you today is the 10 day Green Smoothie Cleansing /Detox Diet by Linden Dolin . available on [place] for around $10.  This is not a low carb or a weight loss diet,  It is fundamentally a "cleansing" low fat diet that eliminates sugar, gluten, caffeine, alcohol and dairy for 10 days .  What you add back after the initial ten days is entirely up to  you!  You can expect to lose 5 to 10 lbs depending on how strict you are.   I suggest drinking 2 smoothies daily and keeping one chewable meal (but keep it simple, like baked fish and salad, Licciardi or bok choy) .  You snack primarily on fresh  fruit, egg whites and judicious quantities of nuts. You can add vegetable based protein powder (nothing with whey , since whey is dairy) in it.  [employer] has a Research officer, political party . Marland Kitchen  It does require some form of a nutrient extractor (Vita Mix, a electric juicer,  Or a Nutribullet Rx).  i have found that using frozen fruits is much more convenient and cost effective. You can even find plenty of organic fruit in the frozen fruit section of [employer].  Just thaw what you need for the following day the night before in the refrigerator (to avoid jamming up your machine)   [employer] sells a frozen berry/kale mixture that you can use to make your smoothie

## 2014-11-24 NOTE — Assessment & Plan Note (Signed)
I have congratulated her in reduction of   BMI and encouraged  Continued weight loss with goal of 10% of body weigh over the next 6 months using a low fat, low glycemic index diet and regular exercise a minimum of 5 days per week.

## 2014-11-27 ENCOUNTER — Encounter: Payer: Self-pay | Admitting: *Deleted

## 2014-12-01 ENCOUNTER — Telehealth: Payer: Self-pay | Admitting: *Deleted

## 2014-12-01 NOTE — Telephone Encounter (Signed)
Spoke with pt, advised of result note.  Pt verbalized understanding

## 2014-12-01 NOTE — Telephone Encounter (Signed)
Patient has called for her lab results from her appt last Monday. -Thanks

## 2014-12-25 ENCOUNTER — Other Ambulatory Visit: Payer: Self-pay | Admitting: Internal Medicine

## 2015-01-24 DIAGNOSIS — Z96651 Presence of right artificial knee joint: Secondary | ICD-10-CM | POA: Insufficient documentation

## 2015-03-27 ENCOUNTER — Ambulatory Visit
Admission: RE | Admit: 2015-03-27 | Discharge: 2015-03-27 | Disposition: A | Discharge status: Home / Self Care | Payer: Medicare Other | Source: Ambulatory Visit | Attending: Internal Medicine | Admitting: Internal Medicine

## 2015-03-27 DIAGNOSIS — Z1231 Encounter for screening mammogram for malignant neoplasm of breast: Secondary | ICD-10-CM | POA: Diagnosis not present

## 2015-03-27 DIAGNOSIS — Z1239 Encounter for other screening for malignant neoplasm of breast: Secondary | ICD-10-CM

## 2015-04-17 ENCOUNTER — Other Ambulatory Visit: Payer: Self-pay | Admitting: Internal Medicine

## 2015-06-01 ENCOUNTER — Encounter: Payer: Self-pay | Admitting: Internal Medicine

## 2015-06-01 ENCOUNTER — Ambulatory Visit (INDEPENDENT_AMBULATORY_CARE_PROVIDER_SITE_OTHER): Payer: Medicare Other | Admitting: Internal Medicine

## 2015-06-01 VITALS — BP 110/70 | HR 87 | Temp 97.7°F | Resp 14 | Ht 63.0 in | Wt 168.2 lb

## 2015-06-01 DIAGNOSIS — Z0000 Encounter for general adult medical examination without abnormal findings: Secondary | ICD-10-CM

## 2015-06-01 DIAGNOSIS — E785 Hyperlipidemia, unspecified: Secondary | ICD-10-CM

## 2015-06-01 DIAGNOSIS — E559 Vitamin D deficiency, unspecified: Secondary | ICD-10-CM

## 2015-06-01 DIAGNOSIS — I10 Essential (primary) hypertension: Secondary | ICD-10-CM

## 2015-06-01 DIAGNOSIS — R5383 Other fatigue: Secondary | ICD-10-CM

## 2015-06-01 DIAGNOSIS — E663 Overweight: Secondary | ICD-10-CM

## 2015-06-01 DIAGNOSIS — Z7289 Other problems related to lifestyle: Secondary | ICD-10-CM | POA: Diagnosis not present

## 2015-06-01 LAB — CBC WITH DIFFERENTIAL/PLATELET
BASOS ABS: 0 10*3/uL (ref 0.0–0.1)
BASOS PCT: 0.6 % (ref 0.0–3.0)
EOS ABS: 0 10*3/uL (ref 0.0–0.7)
Eosinophils Relative: 0.8 % (ref 0.0–5.0)
HCT: 44.3 % (ref 36.0–46.0)
Hemoglobin: 14.9 g/dL (ref 12.0–15.0)
LYMPHS ABS: 1.7 10*3/uL (ref 0.7–4.0)
Lymphocytes Relative: 35.2 % (ref 12.0–46.0)
MCHC: 33.7 g/dL (ref 30.0–36.0)
MCV: 92.1 fl (ref 78.0–100.0)
Monocytes Absolute: 0.4 10*3/uL (ref 0.1–1.0)
Monocytes Relative: 8.6 % (ref 3.0–12.0)
NEUTROS ABS: 2.6 10*3/uL (ref 1.4–7.7)
NEUTROS PCT: 54.8 % (ref 43.0–77.0)
PLATELETS: 276 10*3/uL (ref 150.0–400.0)
RBC: 4.81 Mil/uL (ref 3.87–5.11)
RDW: 13.9 % (ref 11.5–15.5)
WBC: 4.8 10*3/uL (ref 4.0–10.5)

## 2015-06-01 LAB — LIPID PANEL
CHOL/HDL RATIO: 6
CHOLESTEROL: 297 mg/dL — AB (ref 0–200)
HDL: 48.3 mg/dL (ref >39.00)
LDL CALC: 217 mg/dL — AB (ref 0–99)
NonHDL: 249.02
TRIGLYCERIDES: 161 mg/dL — AB (ref 0.0–149.0)
VLDL: 32.2 mg/dL (ref 0.0–40.0)

## 2015-06-01 LAB — COMPREHENSIVE METABOLIC PANEL
ALT: 18 U/L (ref 0–35)
AST: 21 U/L (ref 0–37)
Albumin: 4.7 g/dL (ref 3.5–5.2)
Alkaline Phosphatase: 58 U/L (ref 39–117)
BUN: 18 mg/dL (ref 6–23)
CHLORIDE: 102 meq/L (ref 96–112)
CO2: 31 meq/L (ref 19–32)
Calcium: 9.9 mg/dL (ref 8.4–10.5)
Creatinine, Ser: 0.92 mg/dL (ref 0.40–1.20)
GFR: 64.64 mL/min (ref >60.00)
GLUCOSE: 95 mg/dL (ref 70–99)
POTASSIUM: 3.8 meq/L (ref 3.5–5.1)
SODIUM: 141 meq/L (ref 135–145)
Total Bilirubin: 0.6 mg/dL (ref 0.2–1.2)
Total Protein: 7.3 g/dL (ref 6.0–8.3)

## 2015-06-01 LAB — VITAMIN D 25 HYDROXY (VIT D DEFICIENCY, FRACTURES): VITD: 28.54 ng/mL — ABNORMAL LOW (ref 30.00–100.00)

## 2015-06-01 LAB — TSH: TSH: 0.94 u[IU]/mL (ref 0.35–4.50)

## 2015-06-01 MED ORDER — PHENTERMINE HCL 37.5 MG PO TABS: ORAL_TABLET | ORAL | Status: DC

## 2015-06-01 MED ORDER — PAROXETINE HCL 10 MG PO TABS: 10.0000 mg | ORAL_TABLET | Freq: Every day | ORAL | Status: DC

## 2015-06-01 MED ORDER — FLUTICASONE PROPIONATE 50 MCG/ACT NA SUSP: NASAL | Status: DC

## 2015-06-01 MED ORDER — HYDROCHLOROTHIAZIDE 25 MG PO TABS: 25.0000 mg | ORAL_TABLET | Freq: Every day | ORAL | Status: DC

## 2015-06-01 NOTE — Patient Instructions (Signed)
I have refilled your phentermine for 3 months.    Please plan to have repeat nonfasting labs in 6 months.  We will get a bone density test this year.   Menopause is a normal process in which your reproductive ability comes to an end. This process happens gradually over a span of months to years, usually between the ages of 20 and 5. Menopause is complete when you have missed 12 consecutive menstrual periods. It is important to talk with your health care provider about some of the most common conditions that affect postmenopausal women, such as heart disease, cancer, and bone loss (osteoporosis). Adopting a healthy lifestyle and getting preventive care can help to promote your health and wellness. Those actions can also lower your chances of developing some of these common conditions. WHAT SHOULD I KNOW ABOUT MENOPAUSE? During menopause, you may experience a number of symptoms, such as:  Moderate-to-severe hot flashes.  Night sweats.  Decrease in sex drive.  Mood swings.  Headaches.  Tiredness.  Irritability.  Memory problems.  Insomnia. Choosing to treat or not to treat menopausal changes is an individual decision that you make with your health care provider. WHAT SHOULD I KNOW ABOUT HORMONE REPLACEMENT THERAPY AND SUPPLEMENTS? Hormone therapy products are effective for treating symptoms that are associated with menopause, such as hot flashes and night sweats. Hormone replacement carries certain risks, especially as you become older. If you are thinking about using estrogen or estrogen with progestin treatments, discuss the benefits and risks with your health care provider. WHAT SHOULD I KNOW ABOUT HEART DISEASE AND STROKE? Heart disease, heart attack, and stroke become more likely as you age. This may be due, in part, to the hormonal changes that your body experiences during menopause. These can affect how your body processes dietary fats, triglycerides, and cholesterol. Heart  attack and stroke are both medical emergencies. There are many things that you can do to help prevent heart disease and stroke:  Have your blood pressure checked at least every 1-2 years. High blood pressure causes heart disease and increases the risk of stroke.  If you are 74-36 years old, ask your health care provider if you should take aspirin to prevent a heart attack or a stroke.  Do not use any tobacco products, including cigarettes, chewing tobacco, or electronic cigarettes. If you need help quitting, ask your health care provider.  It is important to eat a healthy diet and maintain a healthy weight.  Be sure to include plenty of vegetables, fruits, low-fat dairy products, and lean protein.  Avoid eating foods that are high in solid fats, added sugars, or salt (sodium).  Get regular exercise. This is one of the most important things that you can do for your health.  Try to exercise for at least 150 minutes each week. The type of exercise that you do should increase your heart rate and make you sweat. This is known as moderate-intensity exercise.  Try to do strengthening exercises at least twice each week. Do these in addition to the moderate-intensity exercise.  Know your numbers.Ask your health care provider to check your cholesterol and your blood glucose. Continue to have your blood tested as directed by your health care provider. WHAT SHOULD I KNOW ABOUT CANCER SCREENING? There are several types of cancer. Take the following steps to reduce your risk and to catch any cancer development as early as possible. Breast Cancer  Practice breast self-awareness.  This means understanding how your breasts normally appear and feel.  It also means doing regular breast self-exams. Let your health care provider know about any changes, no matter how small.  If you are 40 or older, have a clinician do a breast exam (clinical breast exam or CBE) every year. Depending on your age, family  history, and medical history, it may be recommended that you also have a yearly breast X-ray (mammogram).  If you have a family history of breast cancer, talk with your health care provider about genetic screening.  If you are at high risk for breast cancer, talk with your health care provider about having an MRI and a mammogram every year.  Breast cancer (BRCA) gene test is recommended for women who have family members with BRCA-related cancers. Results of the assessment will determine the need for genetic counseling and BRCA1 and for BRCA2 testing. BRCA-related cancers include these types:  Breast. This occurs in males or females.  Ovarian.  Tubal. This may also be called fallopian tube cancer.  Cancer of the abdominal or pelvic lining (peritoneal cancer).  Prostate.  Pancreatic. Cervical, Uterine, and Ovarian Cancer Your health care provider may recommend that you be screened regularly for cancer of the pelvic organs. These include your ovaries, uterus, and vagina. This screening involves a pelvic exam, which includes checking for microscopic changes to the surface of your cervix (Pap test).  For women ages 21-65, health care providers may recommend a pelvic exam and a Pap test every three years. For women ages 30-65, they may recommend the Pap test and pelvic exam, combined with testing for human papilloma virus (HPV), every five years. Some types of HPV increase your risk of cervical cancer. Testing for HPV may also be done on women of any age who have unclear Pap test results.  Other health care providers may not recommend any screening for nonpregnant women who are considered low risk for pelvic cancer and have no symptoms. Ask your health care provider if a screening pelvic exam is right for you.  If you have had past treatment for cervical cancer or a condition that could lead to cancer, you need Pap tests and screening for cancer for at least 20 years after your treatment. If Pap  tests have been discontinued for you, your risk factors (such as having a new sexual partner) need to be reassessed to determine if you should start having screenings again. Some women have medical problems that increase the chance of getting cervical cancer. In these cases, your health care provider may recommend that you have screening and Pap tests more often.  If you have a family history of uterine cancer or ovarian cancer, talk with your health care provider about genetic screening.  If you have vaginal bleeding after reaching menopause, tell your health care provider.  There are currently no reliable tests available to screen for ovarian cancer. Lung Cancer Lung cancer screening is recommended for adults 55-80 years old who are at high risk for lung cancer because of a history of smoking. A yearly low-dose CT scan of the lungs is recommended if you:  Currently smoke.  Have a history of at least 30 pack-years of smoking and you currently smoke or have quit within the past 15 years. A pack-year is smoking an average of one pack of cigarettes per day for one year. Yearly screening should:  Continue until it has been 15 years since you quit.  Stop if you develop a health problem that would prevent you from having lung cancer treatment. Colorectal Cancer    This type of cancer can be detected and can often be prevented.  Routine colorectal cancer screening usually begins at age 50 and continues through age 75.  If you have risk factors for colon cancer, your health care provider may recommend that you be screened at an earlier age.  If you have a family history of colorectal cancer, talk with your health care provider about genetic screening.  Your health care provider may also recommend using home test kits to check for hidden blood in your stool.  A small camera at the end of a tube can be used to examine your colon directly (sigmoidoscopy or colonoscopy). This is done to check for  the earliest forms of colorectal cancer.  Direct examination of the colon should be repeated every 5-10 years until age 75. However, if early forms of precancerous polyps or small growths are found or if you have a family history or genetic risk for colorectal cancer, you may need to be screened more often. Skin Cancer  Check your skin from head to toe regularly.  Monitor any moles. Be sure to tell your health care provider:  About any new moles or changes in moles, especially if there is a change in a mole's shape or color.  If you have a mole that is larger than the size of a pencil eraser.  If any of your family members has a history of skin cancer, especially at a young age, talk with your health care provider about genetic screening.  Always use sunscreen. Apply sunscreen liberally and repeatedly throughout the day.  Whenever you are outside, protect yourself by wearing long sleeves, pants, a wide-brimmed hat, and sunglasses. WHAT SHOULD I KNOW ABOUT OSTEOPOROSIS? Osteoporosis is a condition in which bone destruction happens more quickly than new bone creation. After menopause, you may be at an increased risk for osteoporosis. To help prevent osteoporosis or the bone fractures that can happen because of osteoporosis, the following is recommended:  If you are 19-50 years old, get at least 1,000 mg of calcium and at least 600 mg of vitamin D per day.  If you are older than age 50 but younger than age 70, get at least 1,200 mg of calcium and at least 600 mg of vitamin D per day.  If you are older than age 70, get at least 1,200 mg of calcium and at least 800 mg of vitamin D per day. Smoking and excessive alcohol intake increase the risk of osteoporosis. Eat foods that are rich in calcium and vitamin D, and do weight-bearing exercises several times each week as directed by your health care provider. WHAT SHOULD I KNOW ABOUT HOW MENOPAUSE AFFECTS MY MENTAL HEALTH? Depression may occur at  any age, but it is more common as you become older. Common symptoms of depression include:  Low or sad mood.  Changes in sleep patterns.  Changes in appetite or eating patterns.  Feeling an overall lack of motivation or enjoyment of activities that you previously enjoyed.  Frequent crying spells. Talk with your health care provider if you think that you are experiencing depression. WHAT SHOULD I KNOW ABOUT IMMUNIZATIONS? It is important that you get and maintain your immunizations. These include:  Tetanus, diphtheria, and pertussis (Tdap) booster vaccine.  Influenza every year before the flu season begins.  Pneumonia vaccine.  Shingles vaccine. Your health care provider may also recommend other immunizations.   This information is not intended to replace advice given to you by your health care   provider. Make sure you discuss any questions you have with your health care provider.   Document Released: 05/27/2005 Document Revised: 04/25/2014 Document Reviewed: 12/05/2013 Elsevier Interactive Patient Education 2016 [employer]  

## 2015-06-01 NOTE — Progress Notes (Signed)
Pre visit review using our clinic review tool, if applicable. No additional management support is needed unless otherwise documented below in the visit note. 
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
## 2015-06-02 LAB — HEPATITIS C ANTIBODY: HCV AB: NEGATIVE

## 2015-06-02 NOTE — Assessment & Plan Note (Signed)
Annual comprehensive preventive exam was done as well as an evaluation and management of chronic conditions .  During the course of the visit the patient was educated and counseled about appropriate screening and preventive services including :  diabetes screening, lipid analysis with projected  10 year  risk for CAD , nutrition counseling, breast, cervical and colorectal cancer screening, and recommended immunizations.  Printed recommendations for health maintenance screenings was give 

## 2015-06-02 NOTE — Assessment & Plan Note (Signed)
Well controlled on current regimen. Renal function stable, no changes today.  Lab Results  Component Value Date   CREATININE 0.92 06/01/2015   Lab Results  Component Value Date   NA 141 06/01/2015   K 3.8 06/01/2015   CL 102 06/01/2015   CO2 31 06/01/2015

## 2015-06-02 NOTE — Assessment & Plan Note (Signed)
Improved reduction in triglycerides with diet and exercise. No indication for gemfbrozil. l   History of statin intolerance .  Lab Results  Component Value Date   CHOL 297* 06/01/2015   HDL 48.30 06/01/2015   LDLCALC 217* 06/01/2015   LDLDIRECT 167.0 02/10/2014   TRIG 161.0* 06/01/2015   CHOLHDL 6 06/01/2015

## 2015-06-02 NOTE — Assessment & Plan Note (Signed)
I have congratulated her in reduction of   BMI and encouraged  Continued weight loss with goal of  5% of body weight over the next 3 months using a low glycemic index diet , phentermine for appetite suppression, and regular exercise a minimum of 5 days per week.I have authorized the refill of phentermine for 3 months. Goal is 10 lbs wt loss  

## 2015-06-05 ENCOUNTER — Encounter: Payer: Self-pay | Admitting: Internal Medicine

## 2015-10-05 ENCOUNTER — Encounter: Payer: Self-pay | Admitting: Family Medicine

## 2015-10-05 ENCOUNTER — Ambulatory Visit (INDEPENDENT_AMBULATORY_CARE_PROVIDER_SITE_OTHER): Payer: Medicare Other | Admitting: Family Medicine

## 2015-10-05 VITALS — BP 142/84 | HR 51 | Temp 98.4°F | Ht 63.0 in | Wt 173.0 lb

## 2015-10-05 DIAGNOSIS — S6991XA Unspecified injury of right wrist, hand and finger(s), initial encounter: Secondary | ICD-10-CM | POA: Diagnosis not present

## 2015-10-05 NOTE — Progress Notes (Signed)
   Subjective:  Patient ID: Joanne Benton, female    DOB: 1946/04/08  Age: 70 y.o. MRN: [id number]  CC: R index finger pain/? injury  HPI:  70 year old female presents with the above complaints.  She reports a "pinching" injury 1 week ago while using a pair of scissors. Patient reports that yesterday morning she noted swelling of the ventral aspect of the PIP joint of the right index finger.  Mild to moderate in severity. Mildly painful. She was concerned that she developed an infection so she took some left over amoxicillin. Also she has iced the area and attempted to drain it with a needle. She has had no improvement. No reported redness or draining. Interferes with full mobility. No known exacerbating factors.  No other complaints today.   Social Hx   Social History   Social History  . Marital Status: Married    Spouse Name: N/A  . Number of Children: N/A  . Years of Education: N/A   Social History Main Topics  . Smoking status: Never Smoker   . Smokeless tobacco: Never Used  . Alcohol Use: No  . Drug Use: No  . Sexual Activity: Not Asked   Other Topics Concern  . None   Social History Narrative   Married.   Review of Systems  Constitutional: Negative.   Musculoskeletal:       R index finger pain.   Objective:  BP 142/84 mmHg  Pulse 51  Temp(Src) 98.4 F (36.9 C) (Oral)  Ht 5\' 3"  (1.6 m)  Wt 173 lb (78.472 kg)  BMI 30.65 kg/m2  SpO2 98%  BP/Weight 10/05/2015 [id number]  Systolic BP A999333 [id number]  Diastolic BP 84 70 82  Wt. (Lbs) 173 168.2 160.8  BMI 30.65 29.8 28.49    Physical Exam  Constitutional: She is oriented to person, place, and time. She appears well-developed. No distress.  Pulmonary/Chest: Effort normal.  Musculoskeletal:  Right index finger - swelling noted on ventral side at level of PIP. Decreased ROM in flexion secondary to swelling. No real/true erythema. Central area from puncture noted. No drainage.   Neurological: She is alert and  oriented to person, place, and time.  Psychiatric: She has a normal mood and affect.  Vitals reviewed.  Lab Results  Component Value Date   WBC 4.8 06/01/2015   HGB 14.9 06/01/2015   HCT 44.3 06/01/2015   PLT 276.0 06/01/2015   GLUCOSE 95 06/01/2015   CHOL 297* 06/01/2015   TRIG 161.0* 06/01/2015   HDL 48.30 06/01/2015   LDLDIRECT 167.0 02/10/2014   LDLCALC 217* 06/01/2015   ALT 18 06/01/2015   AST 21 06/01/2015   NA 141 06/01/2015   K 3.8 06/01/2015   CL 102 06/01/2015   CREATININE 0.92 06/01/2015   BUN 18 06/01/2015   CO2 31 06/01/2015   TSH 0.94 06/01/2015   HGBA1C 5.1 08/05/2013    Assessment & Plan:   Problem List Items Addressed This Visit    Injury of right index finger - Primary    New problem. Unclear etiology/prognosis at this time. No evidence of infection. Sending to hand surgery for evaluation. Advised frequent icing.      Relevant Orders   Ambulatory referral to Hand Surgery     Follow-up: PRN  Gonzales

## 2015-10-05 NOTE — Assessment & Plan Note (Signed)
New problem. Unclear etiology/prognosis at this time. No evidence of infection. Sending to hand surgery for evaluation. Advised frequent icing.

## 2015-10-05 NOTE — Patient Instructions (Signed)
We will call with the appt.  Feel free to ice it.  No other medication or intervention.  Take care  Dr. Lacinda Axon

## 2015-10-05 NOTE — Progress Notes (Signed)
Pre visit review using our clinic review tool, if applicable. No additional management support is needed unless otherwise documented below in the visit note. 

## 2015-11-17 HISTORY — PX: OTHER SURGICAL HISTORY: SHX169

## 2015-11-17 HISTORY — PX: FINGER MASS EXCISION: SHX1638

## 2015-11-27 ENCOUNTER — Telehealth: Payer: Self-pay

## 2015-11-27 DIAGNOSIS — E669 Obesity, unspecified: Secondary | ICD-10-CM

## 2015-11-27 DIAGNOSIS — I10 Essential (primary) hypertension: Secondary | ICD-10-CM

## 2015-11-27 DIAGNOSIS — E785 Hyperlipidemia, unspecified: Secondary | ICD-10-CM

## 2015-11-27 NOTE — Telephone Encounter (Signed)
Pt coming for fasting labs 11/30/15. Please place future orders. Thank you.

## 2015-11-29 NOTE — Telephone Encounter (Signed)
Labs ordered.

## 2015-11-30 ENCOUNTER — Other Ambulatory Visit (INDEPENDENT_AMBULATORY_CARE_PROVIDER_SITE_OTHER): Payer: Medicare Other

## 2015-11-30 DIAGNOSIS — I10 Essential (primary) hypertension: Secondary | ICD-10-CM

## 2015-11-30 DIAGNOSIS — E785 Hyperlipidemia, unspecified: Secondary | ICD-10-CM | POA: Diagnosis not present

## 2015-11-30 DIAGNOSIS — E669 Obesity, unspecified: Secondary | ICD-10-CM | POA: Diagnosis not present

## 2015-11-30 LAB — COMPREHENSIVE METABOLIC PANEL
ALBUMIN: 4.3 g/dL (ref 3.5–5.2)
ALT: 14 U/L (ref 0–35)
AST: 16 U/L (ref 0–37)
Alkaline Phosphatase: 49 U/L (ref 39–117)
BILIRUBIN TOTAL: 0.5 mg/dL (ref 0.2–1.2)
BUN: 16 mg/dL (ref 6–23)
CALCIUM: 9.6 mg/dL (ref 8.4–10.5)
CO2: 31 mEq/L (ref 19–32)
Chloride: 102 mEq/L (ref 96–112)
Creatinine, Ser: 0.93 mg/dL (ref 0.40–1.20)
GFR: 63.75 mL/min (ref >60.00)
GLUCOSE: 101 mg/dL — AB (ref 70–99)
Potassium: 4.2 mEq/L (ref 3.5–5.1)
Sodium: 139 mEq/L (ref 135–145)
TOTAL PROTEIN: 6.9 g/dL (ref 6.0–8.3)

## 2015-11-30 LAB — LIPID PANEL
CHOL/HDL RATIO: 6
CHOLESTEROL: 276 mg/dL — AB (ref 0–200)
HDL: 43.5 mg/dL (ref >39.00)
LDL Cholesterol: 196 mg/dL — ABNORMAL HIGH (ref 0–99)
NonHDL: 232.98
TRIGLYCERIDES: 186 mg/dL — AB (ref 0.0–149.0)
VLDL: 37.2 mg/dL (ref 0.0–40.0)

## 2015-11-30 LAB — HEMOGLOBIN A1C: Hgb A1c MFr Bld: 5 % (ref 4.6–6.5)

## 2015-12-02 ENCOUNTER — Encounter: Payer: Self-pay | Admitting: Internal Medicine

## 2015-12-10 ENCOUNTER — Other Ambulatory Visit: Payer: Self-pay | Admitting: Internal Medicine

## 2015-12-28 ENCOUNTER — Ambulatory Visit: Payer: Self-pay | Admitting: Internal Medicine

## 2016-01-02 ENCOUNTER — Encounter: Payer: Self-pay | Admitting: Internal Medicine

## 2016-01-02 ENCOUNTER — Other Ambulatory Visit: Payer: Self-pay | Admitting: Internal Medicine

## 2016-01-04 ENCOUNTER — Encounter: Payer: Self-pay | Admitting: Internal Medicine

## 2016-01-04 ENCOUNTER — Ambulatory Visit (INDEPENDENT_AMBULATORY_CARE_PROVIDER_SITE_OTHER): Payer: Medicare Other | Admitting: Internal Medicine

## 2016-01-04 VITALS — BP 118/76 | HR 68 | Resp 16 | Ht 63.0 in | Wt 171.0 lb

## 2016-01-04 DIAGNOSIS — F411 Generalized anxiety disorder: Secondary | ICD-10-CM

## 2016-01-04 DIAGNOSIS — I10 Essential (primary) hypertension: Secondary | ICD-10-CM

## 2016-01-04 DIAGNOSIS — J3089 Other allergic rhinitis: Secondary | ICD-10-CM

## 2016-01-04 MED ORDER — FLUTICASONE PROPIONATE 50 MCG/ACT NA SUSP: NASAL | 6 refills | Status: DC

## 2016-01-04 NOTE — Progress Notes (Signed)
Date:  01/04/2016   Name:  Joanne Benton   DOB:  05-17-1945   MRN:  [id number]   Chief Complaint: Establish Care (Dr. Leana Roe was last PCP) Hypertension  This is a chronic problem. The current episode started more than 1 year ago. The problem is unchanged. The problem is controlled. Pertinent negatives include no chest pain, palpitations or shortness of breath.  Hyperlipidemia  This is a chronic problem. The problem is uncontrolled. Recent lipid tests were reviewed and are high. Pertinent negatives include no chest pain or shortness of breath. Current antihyperlipidemic treatment includes diet change and exercise (intolerant to statins; had stress test 2005.  Has lost 60 lbs over past few years). The current treatment provides mild improvement of lipids.  Seasonal allergies - uses Flonase spray as needed.  Due for a refill today.  Lab Results  Component Value Date   CHOL 276 (H) 11/30/2015   HDL 43.50 11/30/2015   LDLCALC 196 (H) 11/30/2015   LDLDIRECT 167.0 02/10/2014   TRIG 186.0 (H) 11/30/2015   CHOLHDL 6 11/30/2015   Lab Results  Component Value Date   CREATININE 0.93 11/30/2015   Lab Results  Component Value Date   TSH 0.94 06/01/2015      Review of Systems  Constitutional: Negative for diaphoresis, fatigue and fever.  HENT: Negative for ear pain and sinus pressure.   Respiratory: Negative for cough, chest tightness, shortness of breath and wheezing.   Cardiovascular: Negative for chest pain and palpitations.  Gastrointestinal: Positive for constipation. Negative for abdominal distention and abdominal pain.  Endocrine: Negative for polydipsia and polyuria.  Genitourinary: Negative for frequency and urgency.  Musculoskeletal: Positive for arthralgias (left hip pain).  Skin: Negative for color change and rash.  Neurological: Negative for dizziness, tremors and weakness.  Hematological: Negative for adenopathy.  Psychiatric/Behavioral: Negative for dysphoric mood and  sleep disturbance. The patient is not nervous/anxious.     Patient Active Problem List   Diagnosis Date Noted  . Status post total right knee replacement 01/24/2015  . Nocturnal muscle cramps 12/03/2012  . Alopecia areata 06/25/2012  . Bradycardia 03/26/2012  . Hx of adenomatous colonic polyps   . Overweight (BMI 25.0-29.9) 04/14/2011  . Lumbago without sciatica 04/14/2011  . Hypertension 04/14/2011  . Hyperlipidemia with target LDL less than 100 04/14/2011    Prior to Admission medications   Medication Sig Start Date End Date Taking? Authorizing Provider  Acetaminophen (TYLENOL ARTHRITIS PAIN PO) Take 600 mg by mouth as needed.   Yes Historical Provider, MD  Avocado Oil OIL by Does not apply route.   Yes Historical Provider, MD  [place] 450 MG CAPS Take by mouth.   Yes Historical Provider, MD  fluticasone (FLONASE) 50 MCG/ACT nasal spray USE TWO SPRAY(S) IN EACH NOSTRIL ONCE DAILY 06/01/15  Yes Crecencio Mc, MD  hydrochlorothiazide (HYDRODIURIL) 25 MG tablet Take 1 tablet (25 mg total) by mouth daily. 06/01/15  Yes Crecencio Mc, MD  Multiple Vitamins-Minerals (CENTRUM PO) Take by mouth.   Yes Historical Provider, MD  PARoxetine (PAXIL) 10 MG tablet Take 1 tablet (10 mg total) by mouth daily. 06/01/15  Yes Crecencio Mc, MD    Allergies  Allergen Reactions  . Oxycodone Nausea Only  . Statins Other (See Comments)    Muscle spasms    Past Surgical History:  Procedure Laterality Date  . ABDOMINAL HYSTERECTOMY  1978   secondary to IUD complicaiton   . epidermal inclusion cyst excision of index  finger Right 11/2015  . FINGER MASS EXCISION Right 11/2015   2nd digit  . REPLACEMENT TOTAL KNEE  August 2012   Right , [place]    Social History  Substance Use Topics  . Smoking status: Never Smoker  . Smokeless tobacco: Never Used  . Alcohol use No     Medication list has been reviewed and updated.   Physical Exam  Constitutional: She is oriented to person, place,  and time. She appears well-developed. No distress.  HENT:  Head: Normocephalic and atraumatic.  Neck: Normal range of motion. Neck supple.  Cardiovascular: Normal rate, regular rhythm and normal heart sounds.   Pulmonary/Chest: Effort normal and breath sounds normal. No respiratory distress. She has no decreased breath sounds. She has no wheezes.  Musculoskeletal: Normal range of motion. She exhibits no edema or tenderness.  Neurological: She is alert and oriented to person, place, and time.  Skin: Skin is warm and dry. No rash noted.  Psychiatric: She has a normal mood and affect. Her speech is normal and behavior is normal. Thought content normal.  Nursing note and vitals reviewed.   BP 118/76   Pulse 68   Resp 16   Ht 5\' 3"  (1.6 m)   Wt 171 lb (77.6 kg)   SpO2 99%   BMI 30.29 kg/m   Assessment and Plan: 1. Generalized anxiety disorder Doing well on Paxil  2. Essential hypertension Controlled on HCTZ  3. Environmental and seasonal allergies - fluticasone (FLONASE) 50 MCG/ACT nasal spray; USE TWO SPRAY(S) IN EACH NOSTRIL ONCE DAILY  Dispense: 16 g; Refill: [place], MD [facility]  01/04/2016

## 2016-03-15 ENCOUNTER — Other Ambulatory Visit: Payer: Self-pay | Admitting: Internal Medicine

## 2016-03-15 DIAGNOSIS — Z1231 Encounter for screening mammogram for malignant neoplasm of breast: Secondary | ICD-10-CM

## 2016-03-31 ENCOUNTER — Ambulatory Visit
Admission: RE | Admit: 2016-03-31 | Discharge: 2016-03-31 | Disposition: A | Discharge status: Home / Self Care | Payer: Medicare Other | Source: Ambulatory Visit | Attending: Internal Medicine | Admitting: Internal Medicine

## 2016-03-31 DIAGNOSIS — Z1231 Encounter for screening mammogram for malignant neoplasm of breast: Secondary | ICD-10-CM | POA: Insufficient documentation

## 2016-04-25 IMAGING — MG MM ADDITIONAL VIEWS AT NO CHARGE
2 series · 2 of 2 positions shown · non-contrast
Comparison: 01/10/2014 screening mammogram and prior mammograms
dating back to 07/21/2005.

CLINICAL DATA: 68-year-old female with possible right breast mass
on screening mammogram.

EXAM:
DIGITAL DIAGNOSTIC  RIGHT MAMMOGRAM
ULTRASOUND RIGHT BREAST

[R MLO]
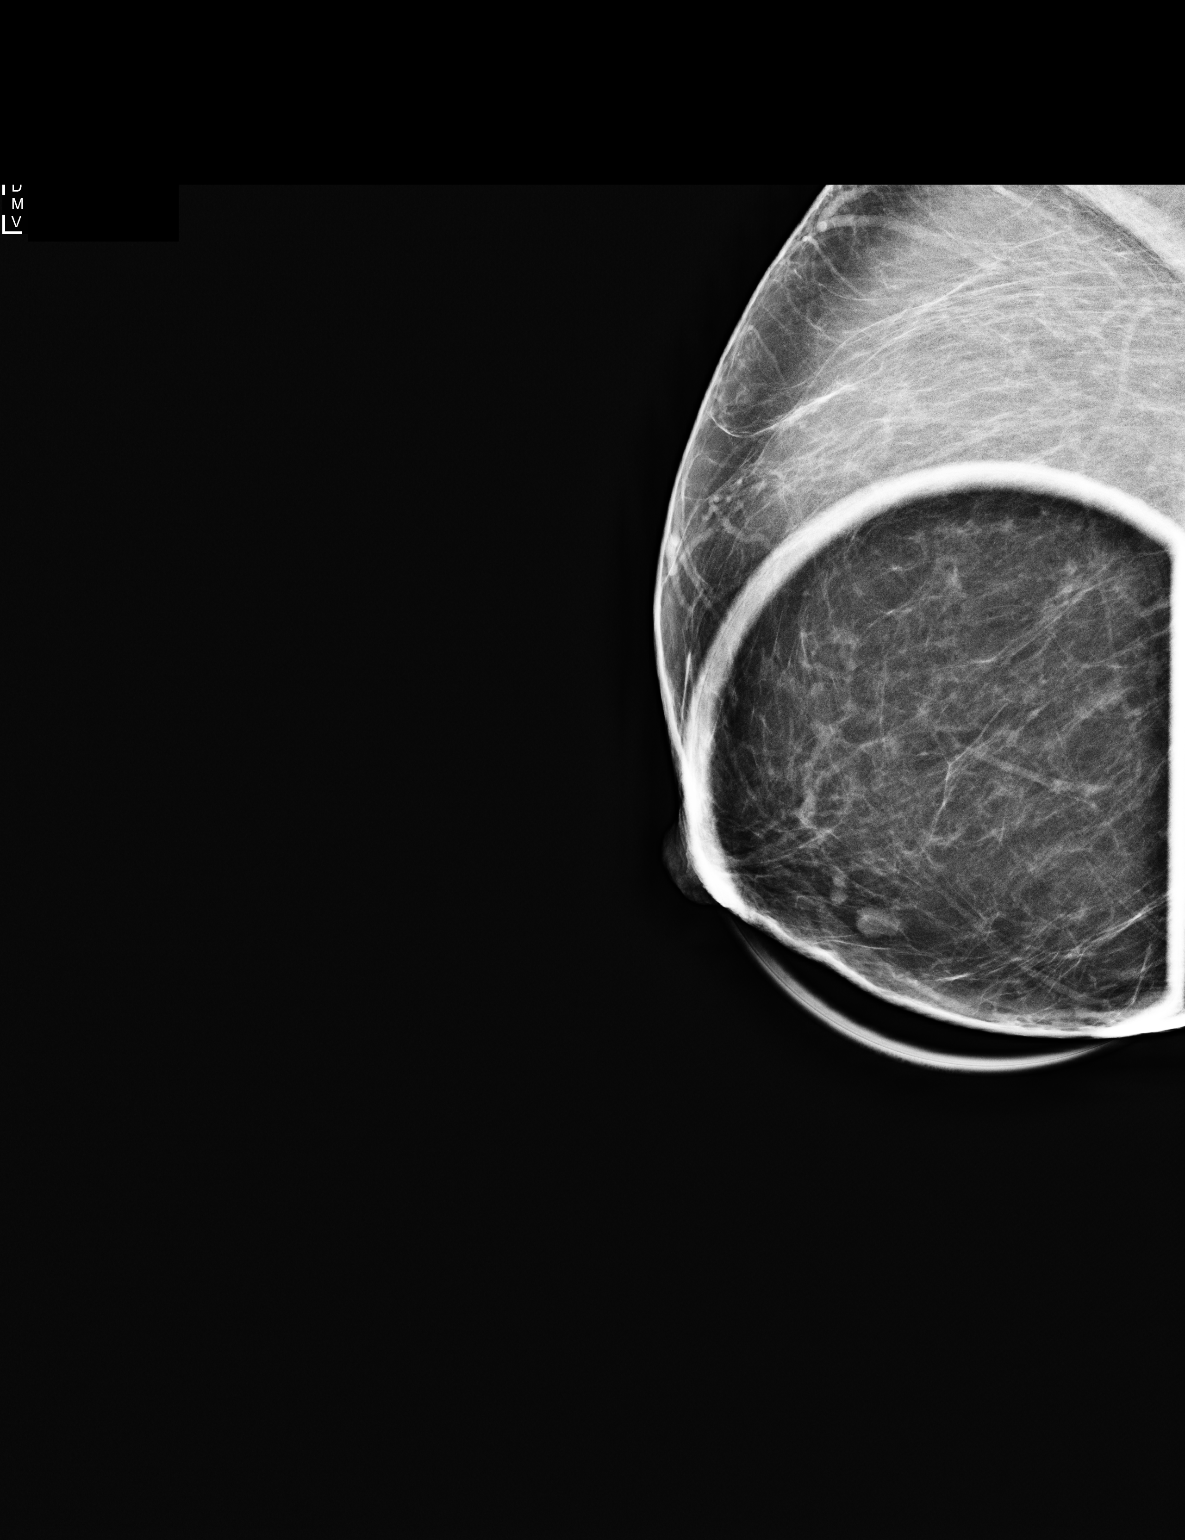

[R CC]
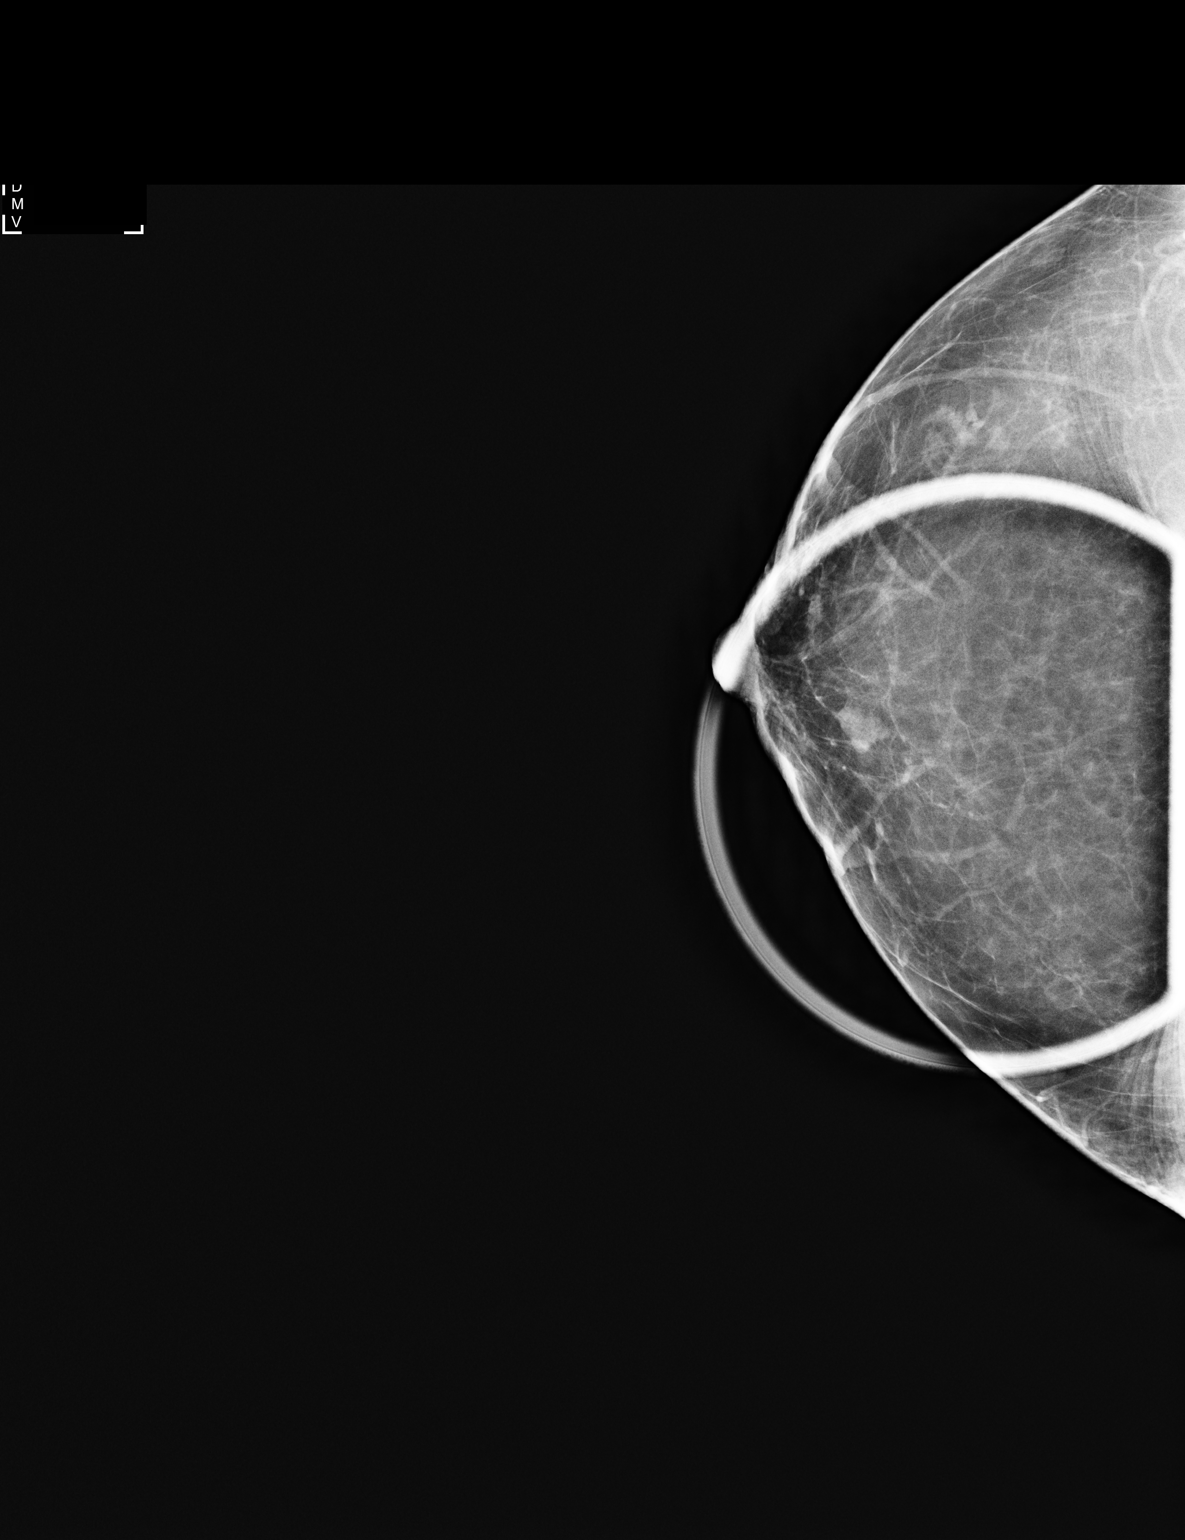

[2 of 2 positions shown; findings below may reference images not displayed]

ACR Breast Density Category b: There are scattered areas of
fibroglandular density.
FINDINGS: Spot compression views of the right breast demonstrate a 4 x 6 mm
circumscribed oval mass in the retroareolar right breast. No
distortion or suspicious calcifications are identified.

On physical exam, no palpable abnormalities are identified in the
retroareolar right breast.

Ultrasound is performed, showing a 5 x 3 x 6 mm benign minimally
complicated cyst in the retroareolar right breast, corresponding to
the screening study finding.
IMPRESSION: Benign cyst in the retroareolar right breast, corresponding to the
screening study finding.

RECOMMENDATION:
Bilateral screening mammograms in 1 year.

I have discussed the findings and recommendations with the patient.
Results were also provided in writing at the conclusion of the
visit. If applicable, a reminder letter will be sent to the patient
regarding the next appointment.

BI-RADS CATEGORY  2: Benign.

## 2016-06-02 ENCOUNTER — Encounter: Payer: Medicare Other | Admitting: Internal Medicine

## 2016-07-07 ENCOUNTER — Ambulatory Visit (INDEPENDENT_AMBULATORY_CARE_PROVIDER_SITE_OTHER): Payer: Medicare Other | Admitting: Internal Medicine

## 2016-07-07 ENCOUNTER — Encounter: Payer: Self-pay | Admitting: Internal Medicine

## 2016-07-07 VITALS — BP 132/80 | HR 68 | Resp 16 | Ht 63.0 in | Wt 184.0 lb

## 2016-07-07 DIAGNOSIS — J3089 Other allergic rhinitis: Secondary | ICD-10-CM | POA: Diagnosis not present

## 2016-07-07 DIAGNOSIS — Z0000 Encounter for general adult medical examination without abnormal findings: Secondary | ICD-10-CM

## 2016-07-07 DIAGNOSIS — F411 Generalized anxiety disorder: Secondary | ICD-10-CM | POA: Diagnosis not present

## 2016-07-07 DIAGNOSIS — I499 Cardiac arrhythmia, unspecified: Secondary | ICD-10-CM | POA: Diagnosis not present

## 2016-07-07 DIAGNOSIS — E785 Hyperlipidemia, unspecified: Secondary | ICD-10-CM | POA: Diagnosis not present

## 2016-07-07 DIAGNOSIS — I10 Essential (primary) hypertension: Secondary | ICD-10-CM | POA: Diagnosis not present

## 2016-07-07 LAB — POCT URINALYSIS DIPSTICK
Bilirubin, UA: NEGATIVE
Blood, UA: NEGATIVE
Glucose, UA: NEGATIVE
Ketones, UA: NEGATIVE
Leukocytes, UA: NEGATIVE
Nitrite, UA: NEGATIVE
Protein, UA: NEGATIVE
Spec Grav, UA: 1.003
Urobilinogen, UA: 0.2
pH, UA: 5

## 2016-07-07 MED ORDER — PAROXETINE HCL 10 MG PO TABS: 10.0000 mg | ORAL_TABLET | Freq: Every day | ORAL | 3 refills | Status: DC

## 2016-07-07 MED ORDER — FLUTICASONE PROPIONATE 50 MCG/ACT NA SUSP: NASAL | 6 refills | Status: DC

## 2016-07-07 MED ORDER — HYDROCHLOROTHIAZIDE 25 MG PO TABS: 25.0000 mg | ORAL_TABLET | Freq: Every day | ORAL | 3 refills | Status: DC

## 2016-07-07 NOTE — Patient Instructions (Signed)
Health Maintenance  Topic Date Due  . MAMMOGRAM  03/31/2017  . COLONOSCOPY  10/05/2021  . TETANUS/TDAP  12/04/2022  . INFLUENZA VACCINE  Addressed  . DEXA SCAN  Completed  . Hepatitis C Screening  Completed  . PNA vac Low Risk Adult  Completed    Breast Self-Awareness Breast self-awareness means being familiar with how your breasts look and feel. It involves checking your breasts regularly and reporting any changes to your health care provider. Practicing breast self-awareness is important. A change in your breasts can be a sign of a serious medical problem. Being familiar with how your breasts look and feel allows you to find any problems early, when treatment is more likely to be successful. All women should practice breast self-awareness, including women who have had breast implants. How to do a breast self-exam One way to learn what is normal for your breasts and whether your breasts are changing is to do a breast self-exam. To do a breast self-exam: Look for Changes   1. Remove all the clothing above your waist. 2. Stand in front of a mirror in a room with good lighting. 3. Put your hands on your hips. 4. Push your hands firmly downward. 5. Compare your breasts in the mirror. Look for differences between them (asymmetry), such as:  Differences in shape.  Differences in size.  Puckers, dips, and bumps in one breast and not the other. 6. Look at each breast for changes in your skin, such as:  Redness.  Scaly areas. 7. Look for changes in your nipples, such as:  Discharge.  Bleeding.  Dimpling.  Redness.  A change in position. Feel for Changes   Carefully feel your breasts for lumps and changes. It is best to do this while lying on your back on the floor and again while sitting or standing in the shower or tub with soapy water on your skin. Feel each breast in the following way:  Place the arm on the side of the breast you are examining above your head.  Feel your  breast with the other hand.  Start in the nipple area and make  inch (2 cm) overlapping circles to feel your breast. Use the pads of your three middle fingers to do this. Apply light pressure, then medium pressure, then firm pressure. The light pressure will allow you to feel the tissue closest to the skin. The medium pressure will allow you to feel the tissue that is a little deeper. The firm pressure will allow you to feel the tissue close to the ribs.  Continue the overlapping circles, moving downward over the breast until you feel your ribs below your breast.  Move one finger-width toward the center of the body. Continue to use the  inch (2 cm) overlapping circles to feel your breast as you move slowly up toward your collarbone.  Continue the up and down exam using all three pressures until you reach your armpit. Write Down What You Find   Write down what is normal for each breast and any changes that you find. Keep a written record with breast changes or normal findings for each breast. By writing this information down, you do not need to depend only on memory for size, tenderness, or location. Write down where you are in your menstrual cycle, if you are still menstruating. If you are having trouble noticing differences in your breasts, do not get discouraged. With time you will become more familiar with the variations in your breasts  and more comfortable with the exam. How often should I examine my breasts? Examine your breasts every month. If you are breastfeeding, the best time to examine your breasts is after a feeding or after using a breast pump. If you menstruate, the best time to examine your breasts is 5-7 days after your period is over. During your period, your breasts are lumpier, and it may be more difficult to notice changes. When should I see my health care provider? See your health care provider if you notice:  A change in shape or size of your breasts or nipples.  A change  in the skin of your breast or nipples, such as a reddened or scaly area.  Unusual discharge from your nipples.  A lump or thick area that was not there before.  Pain in your breasts.  Anything that concerns you. This information is not intended to replace advice given to you by your health care provider. Make sure you discuss any questions you have with your health care provider. Document Released: 04/04/2005 Document Revised: 09/10/2015 Document Reviewed: 02/22/2015 Elsevier Interactive Patient Education  2017 [employer].

## 2016-07-07 NOTE — Progress Notes (Signed)
Patient: Joanne Benton, Female    DOB: Mar 19, 1946, 71 y.o.   MRN: [id number] Visit Date: 07/07/2016  Today's Provider: Halina Maidens, MD   Chief Complaint  Patient presents with  . Medicare Wellness   Subjective:    Annual wellness visit Joanne Benton is a 71 y.o. female who presents today for her Subsequent Annual Wellness Visit. She feels well. She reports exercising regularly. She reports she is sleeping well. She recently had a mammogram.  ----------------------------------------------------------- Hypertension  This is a chronic problem. The problem is controlled. Associated symptoms include anxiety. Pertinent negatives include no chest pain, headaches, palpitations or shortness of breath. There are no associated agents to hypertension. Past treatments include diuretics. The current treatment provides significant improvement. There are no compliance problems.   Anxiety  Presents for follow-up visit. Patient reports no chest pain, dizziness, irritability, nervous/anxious behavior, palpitations or shortness of breath. The severity of symptoms is mild. The quality of sleep is good.   Compliance with medications is 76-100%.    Review of Systems  Constitutional: Positive for unexpected weight change. Negative for chills, fatigue, fever and irritability.  HENT: Negative for congestion, hearing loss, tinnitus, trouble swallowing and voice change.   Eyes: Negative for visual disturbance.  Respiratory: Negative for cough, chest tightness, shortness of breath and wheezing.   Cardiovascular: Negative for chest pain, palpitations and leg swelling.  Gastrointestinal: Negative for abdominal pain, constipation, diarrhea and vomiting.  Endocrine: Negative for polydipsia and polyuria.  Genitourinary: Negative for dysuria, frequency and vaginal bleeding.  Musculoskeletal: Negative for arthralgias, gait problem and joint swelling.  Skin: Negative for color change and rash.  Allergic/Immunologic:  Positive for environmental allergies.  Neurological: Negative for dizziness, tremors, light-headedness and headaches.  Hematological: Negative for adenopathy. Does not bruise/bleed easily.  Psychiatric/Behavioral: Negative for dysphoric mood and sleep disturbance. The patient is not nervous/anxious.     Social History   Social History  . Marital status: Married    Spouse name: N/A  . Number of children: N/A  . Years of education: N/A   Occupational History  . Not on file.   Social History Main Topics  . Smoking status: Never Smoker  . Smokeless tobacco: Never Used  . Alcohol use No  . Drug use: No  . Sexual activity: Not on file   Other Topics Concern  . Not on file   Social History Narrative   Married.    Patient Active Problem List   Diagnosis Date Noted  . Generalized anxiety disorder 01/04/2016  . Status post total right knee replacement 01/24/2015  . Nocturnal muscle cramps 12/03/2012  . Alopecia areata 06/25/2012  . Bradycardia 03/26/2012  . Hx of adenomatous colonic polyps   . Overweight (BMI 25.0-29.9) 04/14/2011  . Lumbago without sciatica 04/14/2011  . Essential hypertension 04/14/2011  . Hyperlipidemia with target LDL less than 100 04/14/2011    Past Surgical History:  Procedure Laterality Date  . ABDOMINAL HYSTERECTOMY  1978   secondary to IUD complicaiton   . epidermal inclusion cyst excision of index finger Right 11/2015  . FINGER MASS EXCISION Right 11/2015   2nd digit  . REPLACEMENT TOTAL KNEE  August 2012   Right , [place]    Her family history includes Breast cancer (age of onset: 103) in her cousin; Breast cancer (age of onset: 11) in her maternal aunt; Breast cancer (age of onset: 30) in her maternal aunt; Diabetes in her mother; Heart disease in her father.     Previous  Medications   ACETAMINOPHEN (TYLENOL ARTHRITIS PAIN PO)    Take 600 mg by mouth as needed.   AVOCADO OIL OIL    by Does not apply route.   CASCARA SAGRADA 450 MG CAPS     Take by mouth.   FLUTICASONE (FLONASE) 50 MCG/ACT NASAL SPRAY    USE TWO SPRAY(S) IN EACH NOSTRIL ONCE DAILY   HYDROCHLOROTHIAZIDE (HYDRODIURIL) 25 MG TABLET    Take 1 tablet (25 mg total) by mouth daily.   MULTIPLE VITAMINS-MINERALS (CENTRUM PO)    Take by mouth.   PAROXETINE (PAXIL) 10 MG TABLET    Take 1 tablet (10 mg total) by mouth daily.    Patient Care Team: Glean Hess, MD as PCP - General (Family Medicine) Dereck Leep, MD (Orthopedic Surgery)      Objective:   Vitals: BP 132/80 (BP Location: Right Arm, Patient Position: Sitting, Cuff Size: Normal)   Pulse 68   Resp 16   Ht 5\' 3"  (1.6 m)   Wt 184 lb (83.5 kg)   SpO2 100%   BMI 32.59 kg/m   Physical Exam  Constitutional: She is oriented to person, place, and time. She appears well-developed and well-nourished. No distress.  HENT:  Head: Normocephalic and atraumatic.  Right Ear: Tympanic membrane and ear canal normal.  Left Ear: Tympanic membrane and ear canal normal.  Nose: Right sinus exhibits no maxillary sinus tenderness. Left sinus exhibits no maxillary sinus tenderness.  Mouth/Throat: Uvula is midline and oropharynx is clear and moist.  Eyes: Conjunctivae and EOM are normal. Right eye exhibits no discharge. Left eye exhibits no discharge. No scleral icterus.  Neck: Normal range of motion. Carotid bruit is not present. No thyromegaly present.  Cardiovascular: Normal rate, regular rhythm, normal heart sounds and normal pulses.   Occasional extrasystoles are present.  Pulmonary/Chest: Effort normal. No respiratory distress. She has no wheezes. Right breast exhibits no mass, no nipple discharge, no skin change and no tenderness. Left breast exhibits no mass, no nipple discharge, no skin change and no tenderness.  Abdominal: Soft. Bowel sounds are normal. There is no hepatosplenomegaly. There is no tenderness. There is no CVA tenderness.  Musculoskeletal: Normal range of motion. She exhibits no edema or tenderness.    Lymphadenopathy:    She has no cervical adenopathy.    She has no axillary adenopathy.  Neurological: She is alert and oriented to person, place, and time. She has normal reflexes. No cranial nerve deficit or sensory deficit.  Skin: Skin is warm, dry and intact. No rash noted.  Psychiatric: She has a normal mood and affect. Her speech is normal and behavior is normal. Thought content normal.  Nursing note and vitals reviewed.   Activities of Daily Living In your present state of health, do you have any difficulty performing the following activities: 07/07/2016 01/04/2016  Hearing? Y N  Vision? N N  Difficulty concentrating or making decisions? N N  Walking or climbing stairs? N N  Dressing or bathing? N N  Doing errands, shopping? - Scientist, forensic and eating ? N -  Using the Toilet? N -  In the past six months, have you accidently leaked urine? N -  Do you have problems with loss of bowel control? N -  Managing your Medications? N -  Managing your Finances? N -  Housekeeping or managing your Housekeeping? N -  Some recent data might be hidden    Fall Risk Assessment Fall Risk  07/07/2016 01/04/2016 08/22/2014  08/05/2013  Falls in the past year? No No No No      Depression Screen PHQ 2/9 Scores 07/07/2016 01/04/2016 08/22/2014 08/05/2013  PHQ - 2 Score 0 0 0 0   6CIT Screen 07/07/2016  What Year? 0 points  What month? 0 points  What time? 0 points  Count back from 20 0 points  Months in reverse 0 points  Repeat phrase 0 points  Total Score 0       Medicare Annual Wellness Visit Summary:  Reviewed patient's Family Medical History Reviewed and updated list of patient's medical providers Assessment of cognitive impairment was done Assessed patient's functional ability Established a written schedule for health screening [place] Completed and Reviewed  Exercise Activities and Dietary recommendations Goals    None      Immunization History   Administered Date(s) Administered  . Influenza Split 03/07/2011, 12/22/2011, 01/10/2014  . Influenza-Unspecified 12/07/2012, 01/06/2015, 12/07/2015  . Pneumococcal Conjugate-13 02/13/2015  . Pneumococcal Polysaccharide-23 02/10/2014  . Tdap 12/03/2012  . Zoster 02/11/2010    Health Maintenance  Topic Date Due  . MAMMOGRAM  03/31/2017  . COLONOSCOPY  10/05/2021  . TETANUS/TDAP  12/04/2022  . INFLUENZA VACCINE  Addressed  . DEXA SCAN  Completed  . Hepatitis C Screening  Completed  . PNA vac Low Risk Adult  Completed    Discussed health benefits of physical activity, and encouraged her to engage in regular exercise appropriate for her age and condition.    ------------------------------------------------------------------------------------------------------------  Assessment & Plan:   1. Medicare annual wellness visit, subsequent Measures satisfied Continue diet and exercise for weight loss - POCT urinalysis dipstick  2. Essential hypertension controlled - CBC with Differential/Platelet - Comprehensive metabolic panel  3. Generalized anxiety disorder Doing well on paxil - TSH  4. Hyperlipidemia with target LDL less than 100 Intolerant of statins - Lipid panel  5. Cardiac arrhythmia, unspecified cardiac arrhythmia type - EKG 12-Lead - SB (unchanged) with non specific T wave abnormality Follow up if sx develop  6. Environmental and seasonal allergies - fluticasone (FLONASE) 50 MCG/ACT nasal spray; USE TWO SPRAY(S) IN EACH NOSTRIL ONCE DAILY  Dispense: 16 g; Refill: 6   Meds ordered this encounter  Medications  . hydrochlorothiazide (HYDRODIURIL) 25 MG tablet    Sig: Take 1 tablet (25 mg total) by mouth daily.    Dispense:  90 tablet    Refill:  3  . fluticasone (FLONASE) 50 MCG/ACT nasal spray    Sig: USE TWO SPRAY(S) IN EACH NOSTRIL ONCE DAILY    Dispense:  16 g    Refill:  6  . PARoxetine (PAXIL) 10 MG tablet    Sig: Take 1 tablet (10 mg total) by mouth  daily.    Dispense:  90 tablet    Refill:  [place], MD [facility]  07/07/2016

## 2016-07-08 LAB — COMPREHENSIVE METABOLIC PANEL
A/G RATIO: 1.8 (ref 1.2–2.2)
ALBUMIN: 4.3 g/dL (ref 3.6–4.8)
ALK PHOS: 62 IU/L (ref 39–117)
ALT: 19 IU/L (ref 0–32)
AST: 21 IU/L (ref 0–40)
BUN/Creatinine Ratio: 16 (ref 12–28)
BUN: 14 mg/dL (ref 8–27)
Bilirubin Total: 0.5 mg/dL (ref 0.0–1.2)
CHLORIDE: 100 mmol/L (ref 96–106)
CO2: 27 mmol/L (ref 18–29)
Calcium: 9.4 mg/dL (ref 8.7–10.3)
Creatinine, Ser: 0.86 mg/dL (ref 0.57–1.00)
GFR calc non Af Amer: 70 mL/min/{1.73_m2} (ref >59)
GFR, EST AFRICAN AMERICAN: 80 mL/min/{1.73_m2} (ref >59)
Globulin, Total: 2.4 g/dL (ref 1.5–4.5)
Glucose: 87 mg/dL (ref 65–99)
POTASSIUM: 4.1 mmol/L (ref 3.5–5.2)
SODIUM: 143 mmol/L (ref 134–144)
Total Protein: 6.7 g/dL (ref 6.0–8.5)

## 2016-07-08 LAB — CBC WITH DIFFERENTIAL/PLATELET
Basophils Absolute: 0 10*3/uL (ref 0.0–0.2)
Basos: 1 %
EOS (ABSOLUTE): 0.1 10*3/uL (ref 0.0–0.4)
EOS: 2 %
HEMATOCRIT: 42 % (ref 34.0–46.6)
HEMOGLOBIN: 13.6 g/dL (ref 11.1–15.9)
Immature Grans (Abs): 0 10*3/uL (ref 0.0–0.1)
Immature Granulocytes: 0 %
LYMPHS ABS: 1.7 10*3/uL (ref 0.7–3.1)
Lymphs: 33 %
MCH: 30.2 pg (ref 26.6–33.0)
MCHC: 32.4 g/dL (ref 31.5–35.7)
MCV: 93 fL (ref 79–97)
MONOCYTES: 9 %
MONOS ABS: 0.5 10*3/uL (ref 0.1–0.9)
NEUTROS ABS: 2.9 10*3/uL (ref 1.4–7.0)
Neutrophils: 55 %
Platelets: 263 10*3/uL (ref 150–379)
RBC: 4.5 x10E6/uL (ref 3.77–5.28)
RDW: 14.3 % (ref 12.3–15.4)
WBC: 5.2 10*3/uL (ref 3.4–10.8)

## 2016-07-08 LAB — LIPID PANEL
Chol/HDL Ratio: 7.1 ratio units — ABNORMAL HIGH (ref 0.0–4.4)
Cholesterol, Total: 306 mg/dL — ABNORMAL HIGH (ref 100–199)
HDL: 43 mg/dL (ref >39)
LDL Calculated: 186 mg/dL — ABNORMAL HIGH (ref 0–99)
Triglycerides: 384 mg/dL — ABNORMAL HIGH (ref 0–149)
VLDL Cholesterol Cal: 77 mg/dL — ABNORMAL HIGH (ref 5–40)

## 2016-07-08 LAB — TSH: TSH: 1.33 u[IU]/mL (ref 0.450–4.500)

## 2016-07-11 ENCOUNTER — Telehealth: Payer: Self-pay

## 2016-07-11 ENCOUNTER — Other Ambulatory Visit: Payer: Self-pay | Admitting: Internal Medicine

## 2016-07-11 MED ORDER — EZETIMIBE 10 MG PO TABS: 10.0000 mg | ORAL_TABLET | Freq: Every day | ORAL | 1 refills | Status: DC

## 2016-07-11 NOTE — Telephone Encounter (Signed)
Pt informed

## 2016-07-11 NOTE — Telephone Encounter (Signed)
There are no coupons for zetia because it is generic.

## 2016-07-11 NOTE — Telephone Encounter (Signed)
ERROR

## 2016-07-11 NOTE — Telephone Encounter (Signed)
Pt called back saying she would like to start taking Zetia send to [facility] in [place]. Would like to know if any coupons are available for this? She said with insurance medications will still be $64 a month.

## 2016-07-12 NOTE — Telephone Encounter (Signed)
She is welcome to change her diet and have lipids rechecked in 4-6 months.

## 2016-07-12 NOTE — Telephone Encounter (Signed)
Pt informed- recheck in 4 to 6 months.

## 2016-07-12 NOTE — Telephone Encounter (Signed)
Pt called asking instead of trying new medication ZETIA she wanted to try to bring down her own cholesterol with diet and exercise first and be rechecked. It this okay, and when will we recheck?

## 2016-08-08 ENCOUNTER — Other Ambulatory Visit: Payer: Self-pay | Admitting: Internal Medicine

## 2016-08-08 ENCOUNTER — Telehealth: Payer: Self-pay

## 2016-08-08 DIAGNOSIS — Z86010 Personal history of colonic polyps: Secondary | ICD-10-CM

## 2016-08-08 NOTE — Telephone Encounter (Signed)
Pt informed

## 2016-08-08 NOTE — Telephone Encounter (Signed)
Pt canceled appointment with Dr. Bary Castilla. Ready for referral to Dr. Allen Norris. Tues and Weds: Call work number: [phone] otherwise call home number.

## 2016-08-08 NOTE — Telephone Encounter (Signed)
She will need to cancel the colonoscopy with Dr. Bary Castilla.  Then I can refer her to Dr. Allen Norris.

## 2016-08-08 NOTE — Telephone Encounter (Signed)
Referral placed.

## 2016-08-08 NOTE — Telephone Encounter (Signed)
Pt calling stating she has colonoscopy June 13th with Dr. Marlyn Corporal. Would like to cancel this and schedule her colonoscopy's for now on with Dr. Allen Norris. Pt wants to switch physicians due to "attitude and disposition of Dr. Marlyn Corporal." Pt approved for me to call back and leave VM. - Will cancel colonoscopy after knowing what to do to get in with Dr. Allen Norris.

## 2016-08-10 ENCOUNTER — Other Ambulatory Visit: Payer: Self-pay

## 2016-08-10 ENCOUNTER — Telehealth: Payer: Self-pay

## 2016-08-10 ENCOUNTER — Telehealth: Payer: Self-pay | Admitting: Gastroenterology

## 2016-08-10 DIAGNOSIS — Z86010 Personal history of colonic polyps: Secondary | ICD-10-CM

## 2016-08-10 NOTE — Telephone Encounter (Signed)
Gastroenterology Pre-Procedure Review  Request Date: 6/1 Requesting Physician: Dr. Allen Norris  PATIENT REVIEW QUESTIONS: The patient responded to the following health history questions as indicated:    1. Are you having any GI issues? no 2. Do you have a personal history of Polyps? yes (pre-cancerous) 3. Do you have a family history of Colon Cancer or Polyps? no 4. Diabetes Mellitus? no 5. Joint replacements in the past 12 months?no 6. Major health problems in the past 3 months?no 7. Any artificial heart valves, MVP, or defibrillator?no    MEDICATIONS & ALLERGIES:    Patient reports the following regarding taking any anticoagulation/antiplatelet therapy:   Plavix, Coumadin, Eliquis, Xarelto, Lovenox, Pradaxa, Brilinta, or Effient? no Aspirin? no  Patient confirms/reports the following medications:  Current Outpatient Prescriptions  Medication Sig Dispense Refill  . Acetaminophen (TYLENOL ARTHRITIS PAIN PO) Take 600 mg by mouth as needed.    Marland Kitchen Avocado Oil OIL by Does not apply route.    Rolena Infante Sagrada 450 MG CAPS Take by mouth.    . ezetimibe (ZETIA) 10 MG tablet Take 1 tablet (10 mg total) by mouth daily. 90 tablet 1  . fluticasone (FLONASE) 50 MCG/ACT nasal spray USE TWO SPRAY(S) IN EACH NOSTRIL ONCE DAILY 16 g 6  . hydrochlorothiazide (HYDRODIURIL) 25 MG tablet Take 1 tablet (25 mg total) by mouth daily. 90 tablet 3  . Multiple Vitamins-Minerals (CENTRUM PO) Take by mouth.    Marland Kitchen PARoxetine (PAXIL) 10 MG tablet Take 1 tablet (10 mg total) by mouth daily. 90 tablet 3   No current facility-administered medications for this visit.     Patient confirms/reports the following allergies:  Allergies  Allergen Reactions  . Oxycodone Nausea Only  . Statins Other (See Comments)    Muscle spasms    No orders of the defined types were placed in this encounter.   AUTHORIZATION INFORMATION Primary Insurance: 1D#: Group #:  Secondary Insurance: 1D#: Group #:  SCHEDULE  INFORMATION: Date: 6/1 Time: Location: [place]

## 2016-08-10 NOTE — Telephone Encounter (Signed)
08/10/16 [employer] website does NOT require a  Prior auth for Colonscopy [id number] / Z86.010 Decision ID# [id number]

## 2016-09-07 ENCOUNTER — Encounter: Payer: Self-pay | Admitting: *Deleted

## 2016-09-15 NOTE — Discharge Instructions (Signed)
General Anesthesia, Adult, Care After °These instructions provide you with information about caring for yourself after your procedure. Your health care provider may also give you more specific instructions. Your treatment has been planned according to current medical practices, but problems sometimes occur. Call your health care provider if you have any problems or questions after your procedure. °What can I expect after the procedure? °After the procedure, it is common to have: °· Vomiting. °· A sore throat. °· Mental slowness. ° °It is common to feel: °· Nauseous. °· Cold or shivery. °· Sleepy. °· Tired. °· Sore or achy, even in parts of your body where you did not have surgery. ° °Follow these instructions at home: °For at least 24 hours after the procedure: °· Do not: °? Participate in activities where you could fall or become injured. °? Drive. °? Use heavy machinery. °? Drink alcohol. °? Take sleeping pills or medicines that cause drowsiness. °? Make important decisions or sign legal documents. °? Take care of children on your own. °· Rest. °Eating and drinking °· If you vomit, drink water, juice, or soup when you can drink without vomiting. °· Drink enough fluid to keep your urine clear or pale yellow. °· Make sure you have little or no nausea before eating solid foods. °· Follow the diet recommended by your health care provider. °General instructions °· Have a responsible adult stay with you until you are awake and alert. °· Return to your normal activities as told by your health care provider. Ask your health care provider what activities are safe for you. °· Take over-the-counter and prescription medicines only as told by your health care provider. °· If you smoke, do not smoke without supervision. °· Keep all follow-up visits as told by your health care provider. This is important. °Contact a health care provider if: °· You continue to have nausea or vomiting at home, and medicines are not helpful. °· You  cannot drink fluids or start eating again. °· You cannot urinate after 8-12 hours. °· You develop a skin rash. °· You have fever. °· You have increasing redness at the site of your procedure. °Get help right away if: °· You have difficulty breathing. °· You have chest pain. °· You have unexpected bleeding. °· You feel that you are having a life-threatening or urgent problem. °This information is not intended to replace advice given to you by your health care provider. Make sure you discuss any questions you have with your health care provider. °Document Released: 07/11/2000 Document Revised: 09/07/2015 Document Reviewed: 03/19/2015 °Elsevier Interactive Patient Education © 2018 Elsevier Inc. ° °

## 2016-09-16 ENCOUNTER — Ambulatory Visit
Admission: RE | Admit: 2016-09-16 | Discharge: 2016-09-16 | Disposition: A | Discharge status: Home / Self Care | Payer: Medicare Other | Source: Ambulatory Visit | Attending: Gastroenterology | Admitting: Gastroenterology

## 2016-09-16 ENCOUNTER — Encounter
Admission: RE | Disposition: A | Discharge status: Home / Self Care | Payer: Self-pay | Source: Ambulatory Visit | Attending: Gastroenterology

## 2016-09-16 ENCOUNTER — Ambulatory Visit: Payer: Medicare Other | Admitting: Anesthesiology

## 2016-09-16 DIAGNOSIS — E669 Obesity, unspecified: Secondary | ICD-10-CM | POA: Insufficient documentation

## 2016-09-16 DIAGNOSIS — E785 Hyperlipidemia, unspecified: Secondary | ICD-10-CM | POA: Diagnosis not present

## 2016-09-16 DIAGNOSIS — K640 First degree hemorrhoids: Secondary | ICD-10-CM | POA: Diagnosis not present

## 2016-09-16 DIAGNOSIS — Z8249 Family history of ischemic heart disease and other diseases of the circulatory system: Secondary | ICD-10-CM | POA: Diagnosis not present

## 2016-09-16 DIAGNOSIS — Z6831 Body mass index (BMI) 31.0-31.9, adult: Secondary | ICD-10-CM | POA: Diagnosis not present

## 2016-09-16 DIAGNOSIS — Z860100 Personal history of colon polyps, unspecified: Secondary | ICD-10-CM

## 2016-09-16 DIAGNOSIS — K6389 Other specified diseases of intestine: Secondary | ICD-10-CM | POA: Diagnosis not present

## 2016-09-16 DIAGNOSIS — K5730 Diverticulosis of large intestine without perforation or abscess without bleeding: Secondary | ICD-10-CM | POA: Insufficient documentation

## 2016-09-16 DIAGNOSIS — D124 Benign neoplasm of descending colon: Secondary | ICD-10-CM | POA: Diagnosis not present

## 2016-09-16 DIAGNOSIS — Z1211 Encounter for screening for malignant neoplasm of colon: Secondary | ICD-10-CM | POA: Diagnosis present

## 2016-09-16 DIAGNOSIS — D122 Benign neoplasm of ascending colon: Secondary | ICD-10-CM | POA: Diagnosis not present

## 2016-09-16 DIAGNOSIS — Z885 Allergy status to narcotic agent status: Secondary | ICD-10-CM | POA: Insufficient documentation

## 2016-09-16 DIAGNOSIS — Z803 Family history of malignant neoplasm of breast: Secondary | ICD-10-CM | POA: Diagnosis not present

## 2016-09-16 DIAGNOSIS — Z79899 Other long term (current) drug therapy: Secondary | ICD-10-CM | POA: Diagnosis not present

## 2016-09-16 DIAGNOSIS — I10 Essential (primary) hypertension: Secondary | ICD-10-CM | POA: Diagnosis not present

## 2016-09-16 DIAGNOSIS — Z90710 Acquired absence of both cervix and uterus: Secondary | ICD-10-CM | POA: Diagnosis not present

## 2016-09-16 DIAGNOSIS — D123 Benign neoplasm of transverse colon: Secondary | ICD-10-CM | POA: Insufficient documentation

## 2016-09-16 DIAGNOSIS — Z833 Family history of diabetes mellitus: Secondary | ICD-10-CM | POA: Diagnosis not present

## 2016-09-16 DIAGNOSIS — Z888 Allergy status to other drugs, medicaments and biological substances status: Secondary | ICD-10-CM | POA: Insufficient documentation

## 2016-09-16 HISTORY — DX: Nausea with vomiting, unspecified: R11.2

## 2016-09-16 HISTORY — DX: Other specified postprocedural states: Z98.890

## 2016-09-16 HISTORY — PX: COLONOSCOPY WITH PROPOFOL: SHX5780

## 2016-09-16 HISTORY — DX: Motion sickness, initial encounter: T75.3XXA

## 2016-09-16 HISTORY — PX: POLYPECTOMY: SHX5525

## 2016-09-16 SURGERY — COLONOSCOPY WITH PROPOFOL
Anesthesia: General | Wound class: Contaminated

## 2016-09-16 MED ORDER — LIDOCAINE HCL (CARDIAC) 20 MG/ML IV SOLN
INTRAVENOUS | Status: DC | PRN
  Administered 2016-09-16: 20 mg via INTRAVENOUS

## 2016-09-16 MED ORDER — SIMETHICONE 40 MG/0.6ML PO SUSP
ORAL | Status: DC | PRN
  Administered 2016-09-16: 08:00:00

## 2016-09-16 MED ORDER — LACTATED RINGERS IV SOLN
INTRAVENOUS | Status: DC
  Administered 2016-09-16: 08:00:00 via INTRAVENOUS

## 2016-09-16 MED ORDER — PROPOFOL 10 MG/ML IV BOLUS
INTRAVENOUS | Status: DC | PRN
  Administered 2016-09-16: 30 mg via INTRAVENOUS
  Administered 2016-09-16: 100 mg via INTRAVENOUS

## 2016-09-16 SURGICAL SUPPLY — 23 items
CANISTER SUCT 1200ML W/VALVE (MISCELLANEOUS) ×4 IMPLANT
CLIP HMST 235XBRD CATH ROT (MISCELLANEOUS) IMPLANT
CLIP RESOLUTION 360 11X235 (MISCELLANEOUS)
FCP ESCP3.2XJMB 240X2.8X (MISCELLANEOUS) ×2
FORCEPS BIOP RAD 4 LRG CAP 4 (CUTTING FORCEPS) ×4 IMPLANT
FORCEPS BIOP RJ4 240 W/NDL (MISCELLANEOUS) ×2
FORCEPS ESCP3.2XJMB 240X2.8X (MISCELLANEOUS) ×2 IMPLANT
GOWN CVR UNV OPN BCK APRN NK (MISCELLANEOUS) ×4 IMPLANT
GOWN ISOL THUMB LOOP REG UNIV (MISCELLANEOUS) ×4
INJECTOR VARIJECT VIN23 (MISCELLANEOUS) IMPLANT
KIT DEFENDO VALVE AND CONN (KITS) IMPLANT
KIT ENDO PROCEDURE OLY (KITS) ×4 IMPLANT
MARKER SPOT ENDO TATTOO 5ML (MISCELLANEOUS) IMPLANT
PAD GROUND ADULT SPLIT (MISCELLANEOUS) IMPLANT
PROBE APC STR FIRE (PROBE) IMPLANT
RETRIEVER NET ROTH 2.5X230 LF (MISCELLANEOUS) IMPLANT
SNARE SHORT THROW 13M SML OVAL (MISCELLANEOUS) IMPLANT
SNARE SHORT THROW 30M LRG OVAL (MISCELLANEOUS) IMPLANT
SNARE SNG USE RND 15MM (INSTRUMENTS) IMPLANT
SPOT EX ENDOSCOPIC TATTOO (MISCELLANEOUS)
TRAP ETRAP POLY (MISCELLANEOUS) ×4 IMPLANT
VARIJECT INJECTOR VIN23 (MISCELLANEOUS)
WATER STERILE IRR 250ML POUR (IV SOLUTION) ×4 IMPLANT

## 2016-09-16 NOTE — H&P (Signed)
Lucilla Lame, MD [facility] [address], [place], [place] [address] Phone:[phone] Fax : [phone]  Primary Care Physician:  Glean Hess, MD Primary Gastroenterologist:  Dr. Allen Norris  Pre-Procedure History & Physical: HPI:  Joanne Benton is a 71 y.o. female is here for an colonoscopy.   Past Medical History:  Diagnosis Date  . Hx of adenomatous colonic polyps 2008   Sankar, repeat due 2013  . Hyperlipidemia    with statin intolerance  . Hypertension   . Knee pain   . Motion sickness    back seat of cars  . Obesity   . PONV (postoperative nausea and vomiting)     Past Surgical History:  Procedure Laterality Date  . ABDOMINAL HYSTERECTOMY  1978   secondary to IUD complicaiton   . COLONOSCOPY    . epidermal inclusion cyst excision of index finger Right 11/2015  . FINGER MASS EXCISION Right 11/2015   2nd digit  . REPLACEMENT TOTAL KNEE  August 2012   Right , [place]    Prior to Admission medications   Medication Sig Start Date End Date Taking? Authorizing Provider  Acetaminophen (TYLENOL ARTHRITIS PAIN PO) Take 600 mg by mouth as needed.   Yes [provider]  Avocado Oil OIL by Does not apply route.   Yes [provider]  Cascara Sagrada 450 MG CAPS Take by mouth.   Yes [provider]  fluticasone (FLONASE) 50 MCG/ACT nasal spray USE TWO SPRAY(S) IN EACH NOSTRIL ONCE DAILY 07/07/16  Yes Glean Hess, MD  hydrochlorothiazide (HYDRODIURIL) 25 MG tablet Take 1 tablet (25 mg total) by mouth daily. 07/07/16  Yes Glean Hess, MD  Multiple Vitamins-Minerals (CENTRUM PO) Take by mouth.   Yes [provider]  PARoxetine (PAXIL) 10 MG tablet Take 1 tablet (10 mg total) by mouth daily. 07/07/16  Yes Glean Hess, MD  ezetimibe (ZETIA) 10 MG tablet Take 1 tablet (10 mg total) by mouth daily. Patient not taking: Reported on 09/07/2016 07/11/16   Glean Hess, MD    Allergies as of 08/10/2016 - Review Complete  07/07/2016  Allergen Reaction Noted  . Oxycodone Nausea Only 04/14/2011  . Statins Other (See Comments) 12/03/2012    Family History  Problem Relation Age of Onset  . Diabetes Mother   . Heart disease Father   . Breast cancer Maternal Aunt 68  . Breast cancer Cousin 39  . Breast cancer Maternal Aunt 69  . Cancer Neg Hx        breast, ovarian, colon    Social History   Social History  . Marital status: Married    Spouse name: N/A  . Number of children: N/A  . Years of education: N/A   Occupational History  . Not on file.   Social History Main Topics  . Smoking status: Never Smoker  . Smokeless tobacco: Never Used  . Alcohol use No  . Drug use: No  . Sexual activity: Not on file   Other Topics Concern  . Not on file   Social History Narrative   Married.    Review of Systems: See HPI, otherwise negative ROS  Physical Exam: Ht 5\' 3"  (1.6 m)   Wt 184 lb (83.5 kg)   BMI 32.59 kg/m  General:   Alert,  pleasant and cooperative in NAD Head:  Normocephalic and atraumatic. Neck:  Supple; no masses or thyromegaly. Lungs:  Clear throughout to auscultation.    Heart:  Regular rate and  rhythm. Abdomen:  Soft, nontender and nondistended. Normal bowel sounds, without guarding, and without rebound.   Neurologic:  Alert and  oriented x4;  grossly normal neurologically.  Impression/Plan: Joanne Benton is here for an colonoscopy to be performed for histroy of colon polyps  Risks, benefits, limitations, and alternatives regarding  colonoscopy have been reviewed with the patient.  Questions have been answered.  All parties agreeable.   Lucilla Lame, MD  09/16/2016, 7:28 AM

## 2016-09-16 NOTE — Anesthesia Procedure Notes (Signed)
Procedure Name: MAC Date/Time: 09/16/2016 8:06 AM Performed by: Janna Arch Pre-anesthesia Checklist: Patient identified, Emergency Drugs available, Suction available and Patient being monitored Patient Re-evaluated:Patient Re-evaluated prior to inductionOxygen Delivery Method: Nasal cannula

## 2016-09-16 NOTE — Anesthesia Preprocedure Evaluation (Signed)
Anesthesia Evaluation  Patient identified by MRN, date of birth, ID band Patient awake    Reviewed: Allergy & Precautions, H&P , NPO status , Patient's Chart, lab work & pertinent test results  History of Anesthesia Complications (+) PONV and history of anesthetic complications  Airway Mallampati: II  TM Distance: >3 FB Neck ROM: full    Dental no notable dental hx.    Pulmonary    Pulmonary exam normal        Cardiovascular hypertension, Normal cardiovascular exam     Neuro/Psych    GI/Hepatic   Endo/Other    Renal/GU      Musculoskeletal   Abdominal   Peds  Hematology   Anesthesia Other Findings   Reproductive/Obstetrics                             Anesthesia Physical Anesthesia Plan  ASA: II  Anesthesia Plan: General   Post-op Pain Management:    Induction:   Airway Management Planned:   Additional Equipment:   Intra-op Plan:   Post-operative Plan:   Informed Consent: I have reviewed the patients History and Physical, chart, labs and discussed the procedure including the risks, benefits and alternatives for the proposed anesthesia with the patient or authorized representative who has indicated his/her understanding and acceptance.     Plan Discussed with:   Anesthesia Plan Comments:         Anesthesia Quick Evaluation

## 2016-09-16 NOTE — Transfer of Care (Signed)
Immediate Anesthesia Transfer of Care Note  Patient: Joanne Benton  Procedure(s) Performed: Procedure(s): COLONOSCOPY WITH PROPOFOL (N/A) POLYPECTOMY  Patient Location: PACU  Anesthesia Type: General  Level of Consciousness: awake, alert  and patient cooperative  Airway and Oxygen Therapy: Patient Spontanous Breathing and Patient connected to supplemental oxygen  Post-op Assessment: Post-op Vital signs reviewed, Patient's Cardiovascular Status Stable, Respiratory Function Stable, Patent Airway and No signs of Nausea or vomiting  Post-op Vital Signs: Reviewed and stable  Complications: No apparent anesthesia complications

## 2016-09-16 NOTE — Op Note (Signed)
[facility] Gastroenterology Patient Name: Joanne Benton Procedure Date: 09/16/2016 8:00 AM MRN: [id number] Account #: [id number] Date of Birth: [birth year] Admit Type: Outpatient Age: 71 Room: [facility] OR ROOM 01 Gender: Female Note Status: Finalized Procedure:            Colonoscopy Indications:          High risk colon cancer surveillance: Personal history                        of colonic polyps Providers:            Lucilla Lame MD, MD Referring MD:         Halina Maidens, MD (Referring MD) Medicines:            Propofol per Anesthesia Complications:        No immediate complications. Procedure:            Pre-Anesthesia Assessment:                       - Prior to the procedure, a History and Physical was                        performed, and patient medications and allergies were                        reviewed. The patient's tolerance of previous                        anesthesia was also reviewed. The risks and benefits of                        the procedure and the sedation options and risks were                        discussed with the patient. All questions were                        answered, and informed consent was obtained. Prior                        Anticoagulants: The patient has taken no previous                        anticoagulant or antiplatelet agents. ASA Grade                        Assessment: II - A patient with mild systemic disease.                        After reviewing the risks and benefits, the patient was                        deemed in satisfactory condition to undergo the                        procedure.                       After obtaining informed consent, the colonoscope was  passed under direct vision. Throughout the procedure,                        the patient's blood pressure, pulse, and oxygen                        saturations were monitored continuously. The Olympus CF                        H180AL  Colonoscope (S#: [id number]) was introduced through                        the anus and advanced to the the cecum, identified by                        appendiceal orifice and ileocecal valve. The                        colonoscopy was performed without difficulty. The                        patient tolerated the procedure well. The quality of                        the bowel preparation was excellent. Findings:      The perianal and digital rectal examinations were normal.      A 8 mm polyp was found in the ascending colon. The polyp was sessile.       The polyp was removed with a cold snare. Resection and retrieval were       complete.      Three sessile polyps were found in the transverse colon. The polyps were       4 to 5 mm in size. These polyps were removed with a cold snare.       Resection and retrieval were complete.      A 4 mm polyp was found in the descending colon. The polyp was sessile.       The polyp was removed with a cold biopsy forceps. Resection and       retrieval were complete.      Three sessile polyps were found in the descending colon. The polyps were       5 to 6 mm in size. These polyps were removed with a cold snare.       Resection and retrieval were complete.      Non-bleeding internal hemorrhoids were found during retroflexion. The       hemorrhoids were Grade I (internal hemorrhoids that do not prolapse).      A diffuse area of severe melanosis was found in the entire colon.      A few small-mouthed diverticula were found in the sigmoid colon. Impression:           - One 8 mm polyp in the ascending colon, removed with a                        cold snare. Resected and retrieved.                       - Three 4 to 5 mm polyps in the transverse colon,  removed with a cold snare. Resected and retrieved.                       - One 4 mm polyp in the descending colon, removed with                        a cold biopsy forceps. Resected and  retrieved.                       - Three 5 to 6 mm polyps in the descending colon,                        removed with a cold snare. Resected and retrieved.                       - Non-bleeding internal hemorrhoids.                       - Melanosis in the colon.                       - Diverticulosis in the sigmoid colon. Recommendation:       - Discharge patient to home.                       - Resume previous diet.                       - Continue present medications.                       - Repeat colonoscopy in 5 years for surveillance. Procedure Code(s):    --- Professional ---                       [phone], Colonoscopy, flexible; with removal of tumor(s),                        polyp(s), or other lesion(s) by snare technique                       [id number], 36, Colonoscopy, flexible; with biopsy, single                        or multiple Diagnosis Code(s):    --- Professional ---                       Z86.010, Personal history of colonic polyps                       D12.2, Benign neoplasm of ascending colon                       D12.3, Benign neoplasm of transverse colon (hepatic                        flexure or splenic flexure)                       D12.4, Benign neoplasm of descending colon CPT copyright 2016 American Medical Association. All rights reserved. The codes documented in this report are preliminary and upon coder  review may  be revised to meet current compliance requirements. Lucilla Lame MD, MD 09/16/2016 8:32:54 AM This report has been signed electronically. Number of Addenda: 0 Note Initiated On: 09/16/2016 8:00 AM Scope Withdrawal Time: 0 hours 11 minutes 45 seconds  Total Procedure Duration: 0 hours 15 minutes 11 seconds       [facility]

## 2016-09-16 NOTE — Anesthesia Postprocedure Evaluation (Signed)
Anesthesia Post Note  Patient: Joanne Benton  Procedure(s) Performed: Procedure(s) (LRB): COLONOSCOPY WITH PROPOFOL (N/A) POLYPECTOMY  Patient location during evaluation: PACU Anesthesia Type: General Level of consciousness: awake and alert and oriented Pain management: satisfactory to patient Vital Signs Assessment: post-procedure vital signs reviewed and stable Respiratory status: spontaneous breathing, nonlabored ventilation and respiratory function stable Cardiovascular status: blood pressure returned to baseline and stable Postop Assessment: Adequate PO intake and No signs of nausea or vomiting Anesthetic complications: no    Raliegh Ip

## 2016-09-19 ENCOUNTER — Encounter: Payer: Self-pay | Admitting: Gastroenterology

## 2016-09-21 ENCOUNTER — Encounter: Payer: Self-pay | Admitting: Gastroenterology

## 2016-09-22 ENCOUNTER — Encounter: Payer: Self-pay | Admitting: Gastroenterology

## 2016-09-28 ENCOUNTER — Ambulatory Visit: Payer: Self-pay | Admitting: General Surgery

## 2016-09-29 ENCOUNTER — Telehealth: Payer: Self-pay | Admitting: Gastroenterology

## 2016-09-29 ENCOUNTER — Telehealth: Payer: Self-pay

## 2016-09-29 NOTE — Telephone Encounter (Signed)
Pt called stating she had a colonoscopy on June 1st and has not been called with results. Could you result this for me so I know what to inform patient about this. She states she had 8 polyps removed and is unsure of those results, too.

## 2016-09-29 NOTE — Telephone Encounter (Signed)
Pt notified of colonoscopy results.  

## 2016-09-29 NOTE — Telephone Encounter (Signed)
Patient left a voice message that she has received her results yet and really wants to speak to you.

## 2016-09-30 NOTE — Telephone Encounter (Signed)
She needs to call the GI MD to get the results.

## 2016-09-30 NOTE — Telephone Encounter (Signed)
Pt informed to call GI doctor to get results.

## 2016-12-08 ENCOUNTER — Other Ambulatory Visit (INDEPENDENT_AMBULATORY_CARE_PROVIDER_SITE_OTHER): Payer: Medicare Other

## 2016-12-08 ENCOUNTER — Encounter: Payer: Self-pay | Admitting: Internal Medicine

## 2016-12-08 DIAGNOSIS — E785 Hyperlipidemia, unspecified: Secondary | ICD-10-CM

## 2016-12-09 LAB — HEPATIC FUNCTION PANEL
ALBUMIN: 4.2 g/dL (ref 3.6–4.8)
ALK PHOS: 60 IU/L (ref 39–117)
ALT: 18 IU/L (ref 0–32)
AST: 21 IU/L (ref 0–40)
BILIRUBIN, DIRECT: 0.09 mg/dL (ref 0.00–0.40)
Bilirubin Total: 0.4 mg/dL (ref 0.0–1.2)
TOTAL PROTEIN: 6.9 g/dL (ref 6.0–8.5)

## 2016-12-09 LAB — LIPID PANEL
CHOL/HDL RATIO: 6.2 ratio — AB (ref 0.0–4.4)
Cholesterol, Total: 278 mg/dL — ABNORMAL HIGH (ref 100–199)
HDL: 45 mg/dL (ref >39)
LDL Calculated: 176 mg/dL — ABNORMAL HIGH (ref 0–99)
Triglycerides: 283 mg/dL — ABNORMAL HIGH (ref 0–149)
VLDL CHOLESTEROL CAL: 57 mg/dL — AB (ref 5–40)

## 2017-01-11 NOTE — Telephone Encounter (Signed)
error 

## 2017-01-11 NOTE — Telephone Encounter (Signed)
Error

## 2017-02-28 ENCOUNTER — Other Ambulatory Visit: Payer: Self-pay | Admitting: Internal Medicine

## 2017-02-28 DIAGNOSIS — Z1231 Encounter for screening mammogram for malignant neoplasm of breast: Secondary | ICD-10-CM

## 2017-04-06 ENCOUNTER — Other Ambulatory Visit: Payer: Self-pay | Admitting: Internal Medicine

## 2017-04-06 ENCOUNTER — Ambulatory Visit
Admission: RE | Admit: 2017-04-06 | Discharge: 2017-04-06 | Disposition: A | Discharge status: Home / Self Care | Payer: Medicare Other | Source: Ambulatory Visit | Attending: Internal Medicine | Admitting: Internal Medicine

## 2017-04-06 DIAGNOSIS — Z1231 Encounter for screening mammogram for malignant neoplasm of breast: Secondary | ICD-10-CM | POA: Diagnosis present

## 2017-06-14 ENCOUNTER — Telehealth: Payer: Self-pay

## 2017-06-14 NOTE — Telephone Encounter (Signed)
Called pt to sched AWV w/ [facility]. LVM requesting returned call.  

## 2017-06-14 NOTE — Telephone Encounter (Signed)
Patient called back and said she does not want a wellness visit.

## 2017-08-01 ENCOUNTER — Other Ambulatory Visit: Payer: Self-pay | Admitting: Internal Medicine

## 2017-08-01 DIAGNOSIS — J3089 Other allergic rhinitis: Secondary | ICD-10-CM

## 2017-08-14 ENCOUNTER — Encounter: Payer: Self-pay | Admitting: Internal Medicine

## 2017-08-14 ENCOUNTER — Ambulatory Visit (INDEPENDENT_AMBULATORY_CARE_PROVIDER_SITE_OTHER): Payer: Medicare Other | Admitting: Internal Medicine

## 2017-08-14 VITALS — BP 126/82 | HR 58 | Resp 16 | Ht 63.0 in | Wt 179.0 lb

## 2017-08-14 DIAGNOSIS — F411 Generalized anxiety disorder: Secondary | ICD-10-CM | POA: Diagnosis not present

## 2017-08-14 DIAGNOSIS — Z1231 Encounter for screening mammogram for malignant neoplasm of breast: Secondary | ICD-10-CM | POA: Diagnosis not present

## 2017-08-14 DIAGNOSIS — I10 Essential (primary) hypertension: Secondary | ICD-10-CM | POA: Diagnosis not present

## 2017-08-14 DIAGNOSIS — Z0001 Encounter for general adult medical examination with abnormal findings: Secondary | ICD-10-CM | POA: Diagnosis not present

## 2017-08-14 DIAGNOSIS — E785 Hyperlipidemia, unspecified: Secondary | ICD-10-CM

## 2017-08-14 DIAGNOSIS — Z0000 Encounter for general adult medical examination without abnormal findings: Secondary | ICD-10-CM

## 2017-08-14 LAB — POCT URINALYSIS DIPSTICK
Glucose, UA: NEGATIVE
KETONES UA: NEGATIVE
Leukocytes, UA: NEGATIVE
Nitrite, UA: NEGATIVE
PH UA: 6 (ref 5.0–8.0)
PROTEIN UA: NEGATIVE
RBC UA: NEGATIVE
SPEC GRAV UA: 1.015 (ref 1.010–1.025)
UROBILINOGEN UA: 0.2 U/dL

## 2017-08-14 NOTE — Patient Instructions (Signed)
Health Maintenance  Topic Date Due  . INFLUENZA VACCINE  11/16/2017  . MAMMOGRAM  04/06/2018  . COLONOSCOPY  09/16/2021  . TETANUS/TDAP  12/04/2022  . DEXA SCAN  Completed  . Hepatitis C Screening  Completed  . PNA vac Low Risk Adult  Completed

## 2017-08-14 NOTE — Progress Notes (Signed)
Date:  08/14/2017   Name:  Joanne Benton   DOB:  01/24/1946   MRN:  [id number]  Pt declined MAW exam with [facility]. Chief Complaint: Annual Exam Joanne Benton is a 72 y.o. female who presents today for her Complete Annual Exam. She feels fairly well - recovering from URI. She reports exercising walking, biking and going to the gym. She reports she is sleeping fairly well. Mammogram was done in December. She has completed the Shingrix series. She has lingering cough but no SOB, fever, wheezing.  Hypertension  This is a chronic problem. The problem is controlled. Associated symptoms include anxiety. Pertinent negatives include no chest pain, headaches, palpitations or shortness of breath. Past treatments include diuretics. The current treatment provides significant improvement.  Hyperlipidemia  This is a chronic problem. Pertinent negatives include no chest pain or shortness of breath. Current antihyperlipidemic treatment includes ezetimibe and herbal therapy. The current treatment provides moderate improvement of lipids.  Anxiety  Presents for follow-up visit. Patient reports no chest pain, dizziness, nervous/anxious behavior, palpitations or shortness of breath.   Compliance with medications is 76-100%.      Review of Systems  Constitutional: Negative for chills, fatigue and fever.  HENT: Negative for congestion, hearing loss, tinnitus, trouble swallowing and voice change.   Eyes: Negative for visual disturbance.  Respiratory: Positive for cough. Negative for chest tightness, shortness of breath and wheezing.   Cardiovascular: Negative for chest pain, palpitations and leg swelling.  Gastrointestinal: Negative for abdominal pain, constipation, diarrhea and vomiting.  Endocrine: Negative for polydipsia and polyuria.  Genitourinary: Negative for dysuria, frequency, genital sores, vaginal bleeding and vaginal discharge.  Musculoskeletal: Negative for arthralgias, gait problem and joint  swelling.  Skin: Negative for color change and rash.  Neurological: Negative for dizziness, tremors, light-headedness and headaches.  Hematological: Negative for adenopathy. Does not bruise/bleed easily.  Psychiatric/Behavioral: Negative for dysphoric mood and sleep disturbance. The patient is not nervous/anxious.     Patient Active Problem List   Diagnosis Date Noted  . Personal history of colonic polyps   . Benign neoplasm of descending colon   . Benign neoplasm of ascending colon   . Benign neoplasm of transverse colon   . Generalized anxiety disorder 01/04/2016  . Status post total right knee replacement 01/24/2015  . Nocturnal muscle cramps 12/03/2012  . Alopecia areata 06/25/2012  . Bradycardia 03/26/2012  . Hx of adenomatous colonic polyps   . Overweight (BMI 25.0-29.9) 04/14/2011  . Lumbago without sciatica 04/14/2011  . Essential hypertension 04/14/2011  . Hyperlipidemia with target LDL less than 100 04/14/2011    Prior to Admission medications   Medication Sig Start Date End Date Taking? Authorizing Provider  Acetaminophen (TYLENOL ARTHRITIS PAIN PO) Take 600 mg by mouth as needed.    [provider]  Avocado Oil OIL by Does not apply route.    [provider]  Cascara Sagrada 450 MG CAPS Take by mouth.    [provider]  ezetimibe (ZETIA) 10 MG tablet Take 1 tablet (10 mg total) by mouth daily. Patient not taking: Reported on 09/07/2016 07/11/16   Glean Hess, MD  fluticasone [facility]) 50 MCG/ACT nasal spray USE 2 SPRAY(S) IN [facility] NOSTRIL DAILY 08/02/17   Glean Hess, MD  hydrochlorothiazide (HYDRODIURIL) 25 MG tablet TAKE 1 TABLET BY MOUTH DAILY 08/02/17   Glean Hess, MD  Multiple Vitamins-Minerals (CENTRUM PO) Take by mouth.    [provider]  PARoxetine (PAXIL) 10 MG tablet  TAKE 1 TABLET BY MOUTH DAILY 08/02/17   Glean Hess, MD    Allergies  Allergen Reactions  . Oxycodone Nausea Only  . Statins Other  (See Comments)    Muscle spasms    Past Surgical History:  Procedure Laterality Date  . ABDOMINAL HYSTERECTOMY  1978   secondary to IUD complicaiton   . COLONOSCOPY    . COLONOSCOPY WITH PROPOFOL N/A 09/16/2016   Procedure: COLONOSCOPY WITH PROPOFOL;  Surgeon: Lucilla Lame, MD;  Location: [place];  Service: Endoscopy;  Laterality: N/A;  . epidermal inclusion cyst excision of index finger Right 11/2015  . FINGER MASS EXCISION Right 11/2015   2nd digit  . POLYPECTOMY  09/16/2016   Procedure: POLYPECTOMY;  Surgeon: Lucilla Lame, MD;  Location: [place];  Service: Endoscopy;;  . REPLACEMENT TOTAL KNEE  August 2012   Right , [place]    Social History   Tobacco Use  . Smoking status: Never Smoker  . Smokeless tobacco: Never Used  Substance Use Topics  . Alcohol use: No  . Drug use: No     Medication list has been reviewed and updated.  PHQ 2/9 Scores 08/14/2017 07/07/2016 01/04/2016 08/22/2014  PHQ - 2 Score 0 0 0 0   6CIT Screen 08/14/2017 07/07/2016  What Year? 0 points 0 points  What month? 0 points 0 points  What time? 0 points 0 points  Count back from 20 0 points 0 points  Months in reverse 0 points 0 points  Repeat phrase 0 points 0 points  Total Score 0 0    Fall Risk  08/14/2017 07/07/2016 01/04/2016 08/22/2014 08/05/2013  Falls in the past year? No No No No No   Functional Status Survey: Is the patient deaf or have difficulty hearing?: No Does the patient have difficulty seeing, even when wearing glasses/contacts?: No Does the patient have difficulty concentrating, remembering, or making decisions?: No Does the patient have difficulty walking or climbing stairs?: No Does the patient have difficulty dressing or bathing?: No Does the patient have difficulty doing errands alone such as visiting a doctor's office or shopping?: No   Physical Exam  Constitutional: She is oriented to person, place, and time. She appears well-developed and well-nourished.  No distress.  HENT:  Head: Normocephalic and atraumatic.  Nose: Right sinus exhibits no maxillary sinus tenderness. Left sinus exhibits no maxillary sinus tenderness.  Mouth/Throat: Uvula is midline and oropharynx is clear and moist.  Excessive cerumen both ears  Eyes: Conjunctivae and EOM are normal. Right eye exhibits no discharge. Left eye exhibits no discharge. No scleral icterus.  Neck: Normal range of motion. Carotid bruit is not present. No erythema present. No thyromegaly present.  Cardiovascular: Normal rate, regular rhythm, normal heart sounds and normal pulses.  Pulmonary/Chest: Effort normal. No respiratory distress. She has no wheezes. Right breast exhibits no mass, no nipple discharge, no skin change and no tenderness. Left breast exhibits no mass, no nipple discharge, no skin change and no tenderness.  Abdominal: Soft. Bowel sounds are normal. There is no hepatosplenomegaly. There is no tenderness. There is no CVA tenderness.  Musculoskeletal: Normal range of motion.  Lymphadenopathy:    She has no cervical adenopathy.    She has no axillary adenopathy.  Neurological: She is alert and oriented to person, place, and time. She has normal reflexes. No cranial nerve deficit or sensory deficit.  Skin: Skin is warm, dry and intact. No rash noted.  Psychiatric: She has a normal mood and affect.  Her speech is normal and behavior is normal. Judgment and thought content normal. Cognition and memory are normal.  Nursing note and vitals reviewed.   BP 126/82   Pulse (!) 58   Resp 16   Ht 5\' 3"  (1.6 m)   Wt 179 lb (81.2 kg)   SpO2 98%   BMI 31.71 kg/m   Assessment and Plan: 1. Annual physical exam Normal exam except for weight - continue healthy diet and regular exercise Pt declined MAW but screening completed.  2. Essential hypertension controlled - CBC with Differential/Platelet - Comprehensive metabolic panel - TSH - POCT urinalysis dipstick  3. Generalized anxiety  disorder Doing well on Paxil  4. Hyperlipidemia with target LDL less than 100 Continue low fat diet and zetia - Lipid panel  5. Encounter for screening mammogram for breast cancer Pt will schedule at [facility] this December   No orders of the defined types were placed in this encounter.   Partially dictated using Editor, commissioning. Any errors are unintentional.  Halina Maidens, MD [facility]  08/14/2017

## 2017-08-15 LAB — CBC WITH DIFFERENTIAL/PLATELET
Basophils Absolute: 0 10*3/uL (ref 0.0–0.2)
Basos: 1 %
EOS (ABSOLUTE): 0.1 10*3/uL (ref 0.0–0.4)
EOS: 2 %
HEMATOCRIT: 43 % (ref 34.0–46.6)
HEMOGLOBIN: 14 g/dL (ref 11.1–15.9)
Immature Grans (Abs): 0.1 10*3/uL (ref 0.0–0.1)
Immature Granulocytes: 1 %
Lymphocytes Absolute: 1.9 10*3/uL (ref 0.7–3.1)
Lymphs: 34 %
MCH: 30.6 pg (ref 26.6–33.0)
MCHC: 32.6 g/dL (ref 31.5–35.7)
MCV: 94 fL (ref 79–97)
MONOCYTES: 9 %
Monocytes Absolute: 0.5 10*3/uL (ref 0.1–0.9)
NEUTROS ABS: 3.1 10*3/uL (ref 1.4–7.0)
Neutrophils: 53 %
Platelets: 294 10*3/uL (ref 150–379)
RBC: 4.57 x10E6/uL (ref 3.77–5.28)
RDW: 14.7 % (ref 12.3–15.4)
WBC: 5.7 10*3/uL (ref 3.4–10.8)

## 2017-08-15 LAB — LIPID PANEL
Chol/HDL Ratio: 6.4 ratio — ABNORMAL HIGH (ref 0.0–4.4)
Cholesterol, Total: 289 mg/dL — ABNORMAL HIGH (ref 100–199)
HDL: 45 mg/dL (ref >39)
LDL Calculated: 194 mg/dL — ABNORMAL HIGH (ref 0–99)
Triglycerides: 250 mg/dL — ABNORMAL HIGH (ref 0–149)
VLDL Cholesterol Cal: 50 mg/dL — ABNORMAL HIGH (ref 5–40)

## 2017-08-15 LAB — COMPREHENSIVE METABOLIC PANEL
ALBUMIN: 4.5 g/dL (ref 3.6–4.8)
ALK PHOS: 65 IU/L (ref 39–117)
ALT: 21 IU/L (ref 0–32)
AST: 22 IU/L (ref 0–40)
Albumin/Globulin Ratio: 1.7 (ref 1.2–2.2)
BILIRUBIN TOTAL: 0.5 mg/dL (ref 0.0–1.2)
BUN / CREAT RATIO: 18 (ref 12–28)
BUN: 17 mg/dL (ref 8–27)
CO2: 26 mmol/L (ref 20–29)
CREATININE: 0.94 mg/dL (ref 0.57–1.00)
Calcium: 9.7 mg/dL (ref 8.7–10.3)
Chloride: 100 mmol/L (ref 96–106)
GFR calc Af Amer: 72 mL/min/{1.73_m2} (ref >59)
GFR calc non Af Amer: 62 mL/min/{1.73_m2} (ref >59)
GLOBULIN, TOTAL: 2.6 g/dL (ref 1.5–4.5)
Glucose: 78 mg/dL (ref 65–99)
Potassium: 3.8 mmol/L (ref 3.5–5.2)
SODIUM: 144 mmol/L (ref 134–144)
Total Protein: 7.1 g/dL (ref 6.0–8.5)

## 2017-08-15 LAB — TSH: TSH: 1.87 u[IU]/mL (ref 0.450–4.500)

## 2017-08-23 ENCOUNTER — Encounter: Payer: Self-pay | Admitting: Internal Medicine

## 2017-08-23 ENCOUNTER — Ambulatory Visit: Payer: Medicare Other | Admitting: Internal Medicine

## 2017-08-23 VITALS — BP 124/82 | HR 84 | Temp 98.3°F | Resp 16 | Ht 63.0 in | Wt 176.8 lb

## 2017-08-23 DIAGNOSIS — J0100 Acute maxillary sinusitis, unspecified: Secondary | ICD-10-CM | POA: Diagnosis not present

## 2017-08-23 DIAGNOSIS — R05 Cough: Secondary | ICD-10-CM | POA: Diagnosis not present

## 2017-08-23 DIAGNOSIS — R059 Cough, unspecified: Secondary | ICD-10-CM

## 2017-08-23 MED ORDER — GUAIFENESIN-CODEINE 100-10 MG/5ML PO SYRP: 5.0000 mL | ORAL_SOLUTION | Freq: Three times a day (TID) | ORAL | 0 refills | Status: DC | PRN

## 2017-08-23 MED ORDER — DOXYCYCLINE HYCLATE 100 MG PO TABS: 100.0000 mg | ORAL_TABLET | Freq: Two times a day (BID) | ORAL | 0 refills | Status: AC

## 2017-08-23 NOTE — Progress Notes (Signed)
Date:  08/23/2017   Name:  Joanne Benton   DOB:  1945-12-21   MRN:  [id number]   Chief Complaint: Cough (Been sick since 08/07/2017 and now has sore Right side of neck/throat where lymphnodes are. Has SOB,coughing and some headache. Tight in sinus cavities and chest. )  Cough  This is a new problem. The current episode started 1 to 4 weeks ago. The problem has been gradually worsening. The problem occurs every few minutes. Associated symptoms include ear congestion, headaches, nasal congestion and a sore throat. Pertinent negatives include no chest pain, chills, ear pain, fever, shortness of breath or wheezing. There is no history of asthma or COPD.     Review of Systems  Constitutional: Positive for fatigue. Negative for chills and fever.  HENT: Positive for sinus pressure and sore throat. Negative for ear pain, hearing loss, trouble swallowing and voice change.   Eyes: Negative for visual disturbance.  Respiratory: Positive for cough. Negative for chest tightness, shortness of breath and wheezing.   Cardiovascular: Negative for chest pain, palpitations and leg swelling.  Gastrointestinal: Negative for abdominal pain, nausea and vomiting.  Neurological: Positive for headaches. Negative for dizziness and syncope.    Patient Active Problem List   Diagnosis Date Noted  . Personal history of colonic polyps   . Benign neoplasm of descending colon   . Benign neoplasm of ascending colon   . Benign neoplasm of transverse colon   . Generalized anxiety disorder 01/04/2016  . Status post total right knee replacement 01/24/2015  . Nocturnal muscle cramps 12/03/2012  . Alopecia areata 06/25/2012  . Bradycardia 03/26/2012  . Hx of adenomatous colonic polyps   . Overweight (BMI 25.0-29.9) 04/14/2011  . Lumbago without sciatica 04/14/2011  . Essential hypertension 04/14/2011  . Hyperlipidemia with target LDL less than 100 04/14/2011    Prior to Admission medications   Medication Sig Start  Date End Date Taking? Authorizing Provider  Acetaminophen (TYLENOL ARTHRITIS PAIN PO) Take 600 mg by mouth as needed.   Yes [provider]  Avocado Oil OIL by Does not apply route.   Yes [provider]  Cascara Sagrada 450 MG CAPS Take by mouth.   Yes [provider]  fluticasone (FLONASE) 50 MCG/ACT nasal spray USE 2 SPRAY(S) IN EACH NOSTRIL DAILY 08/02/17  Yes Glean Hess, MD  hydrochlorothiazide (HYDRODIURIL) 25 MG tablet TAKE 1 TABLET BY MOUTH DAILY 08/02/17  Yes Glean Hess, MD  Multiple Vitamins-Minerals (CENTRUM PO) Take by mouth.   Yes [provider]  PARoxetine (PAXIL) 10 MG tablet TAKE 1 TABLET BY MOUTH DAILY 08/02/17  Yes Glean Hess, MD    Allergies  Allergen Reactions  . Oxycodone Nausea Only  . Statins Other (See Comments)    Muscle spasms    Past Surgical History:  Procedure Laterality Date  . ABDOMINAL HYSTERECTOMY  1978   secondary to IUD complicaiton   . COLONOSCOPY    . COLONOSCOPY WITH PROPOFOL N/A 09/16/2016   Procedure: COLONOSCOPY WITH PROPOFOL;  Surgeon: Lucilla Lame, MD;  Location: [place];  Service: Endoscopy;  Laterality: N/A;  . epidermal inclusion cyst excision of index finger Right 11/2015  . FINGER MASS EXCISION Right 11/2015   2nd digit  . POLYPECTOMY  09/16/2016   Procedure: POLYPECTOMY;  Surgeon: Lucilla Lame, MD;  Location: [place];  Service: Endoscopy;;  . REPLACEMENT TOTAL KNEE  August 2012   Right , [place]    Social History  Tobacco Use  . Smoking status: Never Smoker  . Smokeless tobacco: Never Used  Substance Use Topics  . Alcohol use: No  . Drug use: No     Medication list has been reviewed and updated.  PHQ 2/9 Scores 08/14/2017 07/07/2016 01/04/2016 08/22/2014  PHQ - 2 Score 0 0 0 0    Physical Exam  Constitutional: She is oriented to person, place, and time. She appears well-developed and well-nourished. No distress.  HENT:  Head: Normocephalic and  atraumatic.  Right Ear: External ear and ear canal normal. Tympanic membrane is not erythematous and not retracted.  Left Ear: External ear and ear canal normal. Tympanic membrane is not erythematous and not retracted.  Nose: Right sinus exhibits maxillary sinus tenderness and frontal sinus tenderness. Left sinus exhibits maxillary sinus tenderness and frontal sinus tenderness.  Mouth/Throat: Uvula is midline and mucous membranes are normal. No oral lesions. Posterior oropharyngeal erythema present. No oropharyngeal exudate.  Cardiovascular: Normal rate, regular rhythm and normal heart sounds.  Pulmonary/Chest: Effort normal and breath sounds normal. No respiratory distress. She has no wheezes. She has no rales.  Musculoskeletal: Normal range of motion.  Lymphadenopathy:    She has no cervical adenopathy.  Neurological: She is alert and oriented to person, place, and time.  Skin: Skin is warm and dry. No rash noted.  Psychiatric: She has a normal mood and affect. Her behavior is normal. Thought content normal.  Nursing note and vitals reviewed.   BP 124/82   Pulse 84   Temp 98.3 F (36.8 C) (Oral)   Resp 16   Ht 5\' 3"  (1.6 m)   Wt 176 lb 12.8 oz (80.2 kg)   SpO2 100%   BMI 31.32 kg/m   Assessment and Plan: 1. Acute non-recurrent maxillary sinusitis mucinex bid - doxycycline (VIBRA-TABS) 100 MG tablet; Take 1 tablet (100 mg total) by mouth 2 (two) times daily for 10 days.  Dispense: 20 tablet; Refill: 0  2. Cough - guaiFENesin-codeine (ROBITUSSIN AC) 100-10 MG/5ML syrup; Take 5 mLs by mouth 3 (three) times daily as needed for cough.  Dispense: 118 mL; Refill: 0   Meds ordered this encounter  Medications  . doxycycline (VIBRA-TABS) 100 MG tablet    Sig: Take 1 tablet (100 mg total) by mouth 2 (two) times daily for 10 days.    Dispense:  20 tablet    Refill:  0  . guaiFENesin-codeine (ROBITUSSIN AC) 100-10 MG/5ML syrup    Sig: Take 5 mLs by mouth 3 (three) times daily as  needed for cough.    Dispense:  118 mL    Refill:  0    Partially dictated using Editor, commissioning. Any errors are unintentional.  Halina Maidens, MD [facility]  08/23/2017

## 2017-08-23 NOTE — Patient Instructions (Signed)
Start Mucinex (plain) twice a day  Increase your fluids

## 2017-09-14 ENCOUNTER — Encounter: Payer: Self-pay | Admitting: Internal Medicine

## 2017-09-14 ENCOUNTER — Ambulatory Visit: Payer: Medicare Other | Admitting: Internal Medicine

## 2017-09-14 VITALS — BP 138/86 | HR 64 | Temp 98.1°F | Resp 16 | Ht 63.0 in | Wt 192.0 lb

## 2017-09-14 DIAGNOSIS — J40 Bronchitis, not specified as acute or chronic: Secondary | ICD-10-CM | POA: Diagnosis not present

## 2017-09-14 MED ORDER — LEVOFLOXACIN 500 MG PO TABS: 500.0000 mg | ORAL_TABLET | Freq: Every day | ORAL | 0 refills | Status: DC

## 2017-09-14 MED ORDER — GUAIFENESIN-CODEINE 100-10 MG/5ML PO SYRP: 5.0000 mL | ORAL_SOLUTION | Freq: Three times a day (TID) | ORAL | 0 refills | Status: DC | PRN

## 2017-09-14 NOTE — Progress Notes (Signed)
Date:  09/14/2017   Name:  Joanne Benton   DOB:  Sep 18, 1945   MRN:  [id number]   Chief Complaint: URI Cough  This is a recurrent problem. The current episode started in the past 7 days. The problem has been gradually worsening. The problem occurs hourly. The cough is non-productive. Associated symptoms include postnasal drip, shortness of breath and wheezing. Pertinent negatives include no chest pain, chills, ear congestion, ear pain, fever, headaches, nasal congestion or sore throat. The symptoms are aggravated by exercise and pollens.  She felt improved after doxycycline and began to return to the gym and work outside in the yard.  Within about 4 days, she started to cough again with scant phlegm and mild SOB.   Review of Systems  Constitutional: Negative for chills, diaphoresis and fever.  HENT: Positive for postnasal drip. Negative for ear pain, sore throat and trouble swallowing.   Respiratory: Positive for cough, shortness of breath and wheezing. Negative for chest tightness.   Cardiovascular: Negative for chest pain and palpitations.  Gastrointestinal: Negative for abdominal pain, diarrhea and nausea.  Neurological: Negative for dizziness and headaches.    Patient Active Problem List   Diagnosis Date Noted  . Personal history of colonic polyps   . Benign neoplasm of descending colon   . Benign neoplasm of ascending colon   . Benign neoplasm of transverse colon   . Generalized anxiety disorder 01/04/2016  . Status post total right knee replacement 01/24/2015  . Nocturnal muscle cramps 12/03/2012  . Alopecia areata 06/25/2012  . Bradycardia 03/26/2012  . Hx of adenomatous colonic polyps   . Overweight (BMI 25.0-29.9) 04/14/2011  . Lumbago without sciatica 04/14/2011  . Essential hypertension 04/14/2011  . Hyperlipidemia with target LDL less than 100 04/14/2011    Prior to Admission medications   Medication Sig Start Date End Date Taking? Authorizing Provider    Acetaminophen (TYLENOL ARTHRITIS PAIN PO) Take 600 mg by mouth as needed.   Yes [provider]  Avocado Oil OIL by Does not apply route.   Yes [provider]  Cascara Sagrada 450 MG CAPS Take by mouth.   Yes [provider]  fluticasone (FLONASE) 50 MCG/ACT nasal spray USE 2 SPRAY(S) IN EACH NOSTRIL DAILY 08/02/17  Yes Glean Hess, MD  hydrochlorothiazide (HYDRODIURIL) 25 MG tablet TAKE 1 TABLET BY MOUTH DAILY 08/02/17  Yes Glean Hess, MD  Multiple Vitamins-Minerals (CENTRUM PO) Take by mouth.   Yes [provider]  PARoxetine (PAXIL) 10 MG tablet TAKE 1 TABLET BY MOUTH DAILY 08/02/17  Yes Glean Hess, MD    Allergies  Allergen Reactions  . Oxycodone Nausea Only  . Statins Other (See Comments)    Muscle spasms    Past Surgical History:  Procedure Laterality Date  . ABDOMINAL HYSTERECTOMY  1978   secondary to IUD complicaiton   . COLONOSCOPY    . COLONOSCOPY WITH PROPOFOL N/A 09/16/2016   Procedure: COLONOSCOPY WITH PROPOFOL;  Surgeon: Lucilla Lame, MD;  Location: [place];  Service: Endoscopy;  Laterality: N/A;  . epidermal inclusion cyst excision of index finger Right 11/2015  . FINGER MASS EXCISION Right 11/2015   2nd digit  . POLYPECTOMY  09/16/2016   Procedure: POLYPECTOMY;  Surgeon: Lucilla Lame, MD;  Location: [place];  Service: Endoscopy;;  . REPLACEMENT TOTAL KNEE  August 2012   Right , [place]    Social History   Tobacco Use  . Smoking status: Never Smoker  .  Smokeless tobacco: Never Used  Substance Use Topics  . Alcohol use: No  . Drug use: No     Medication list has been reviewed and updated.  Current Meds  Medication Sig  . Acetaminophen (TYLENOL ARTHRITIS PAIN PO) Take 600 mg by mouth as needed.  Marland Kitchen Avocado Oil OIL by Does not apply route.  Rolena Infante Sagrada 450 MG CAPS Take by mouth.  . fluticasone (FLONASE) 50 MCG/ACT nasal spray USE 2 SPRAY(S) IN EACH NOSTRIL DAILY  .  hydrochlorothiazide (HYDRODIURIL) 25 MG tablet TAKE 1 TABLET BY MOUTH DAILY  . Multiple Vitamins-Minerals (CENTRUM PO) Take by mouth.  Marland Kitchen PARoxetine (PAXIL) 10 MG tablet TAKE 1 TABLET BY MOUTH DAILY  . [DISCONTINUED] guaiFENesin-codeine (ROBITUSSIN AC) 100-10 MG/5ML syrup Take 5 mLs by mouth 3 (three) times daily as needed for cough.    PHQ 2/9 Scores 09/14/2017 08/14/2017 07/07/2016 01/04/2016  PHQ - 2 Score 0 0 0 0    Physical Exam  Constitutional: She is oriented to person, place, and time.  HENT:  Nose: Right sinus exhibits maxillary sinus tenderness. Right sinus exhibits no frontal sinus tenderness. Left sinus exhibits maxillary sinus tenderness. Left sinus exhibits no frontal sinus tenderness.  Mouth/Throat: No posterior oropharyngeal erythema.  Eyes: Pupils are equal, round, and reactive to light.  Neck: Normal range of motion. Neck supple.  Cardiovascular: Normal rate, regular rhythm and normal heart sounds.  Pulmonary/Chest: Effort normal and breath sounds normal. She has no wheezes. She has no rales.  Neurological: She is alert and oriented to person, place, and time.  Psychiatric: She has a normal mood and affect.    BP (!) 142/90   Pulse 64   Temp 98.1 F (36.7 C) (Oral)   Resp 16   Ht 5\' 3"  (1.6 m)   Wt 192 lb (87.1 kg)   SpO2 98%   BMI 34.01 kg/m   Assessment and Plan: 1. Bronchitis Needs additional antibiotics Continue fluids and activity as tolerated - levofloxacin (LEVAQUIN) 500 MG tablet; Take 1 tablet (500 mg total) by mouth daily.  Dispense: 7 tablet; Refill: 0 - guaiFENesin-codeine (ROBITUSSIN AC) 100-10 MG/5ML syrup; Take 5 mLs by mouth 3 (three) times daily as needed for cough.  Dispense: 118 mL; Refill: 0   Meds ordered this encounter  Medications  . levofloxacin (LEVAQUIN) 500 MG tablet    Sig: Take 1 tablet (500 mg total) by mouth daily.    Dispense:  7 tablet    Refill:  0  . guaiFENesin-codeine (ROBITUSSIN AC) 100-10 MG/5ML syrup    Sig: Take 5  mLs by mouth 3 (three) times daily as needed for cough.    Dispense:  118 mL    Refill:  0    Partially dictated using Editor, commissioning. Any errors are unintentional.  Halina Maidens, MD [facility]  09/14/2017   There are no diagnoses linked to this encounter.

## 2017-09-14 NOTE — Patient Instructions (Signed)

## 2018-02-26 ENCOUNTER — Other Ambulatory Visit: Payer: Self-pay | Admitting: Internal Medicine

## 2018-02-26 DIAGNOSIS — Z1231 Encounter for screening mammogram for malignant neoplasm of breast: Secondary | ICD-10-CM

## 2018-04-09 ENCOUNTER — Ambulatory Visit
Admission: RE | Admit: 2018-04-09 | Discharge: 2018-04-09 | Disposition: A | Discharge status: Home / Self Care | Payer: Medicare Other | Source: Ambulatory Visit | Attending: Internal Medicine | Admitting: Internal Medicine

## 2018-04-09 DIAGNOSIS — Z1231 Encounter for screening mammogram for malignant neoplasm of breast: Secondary | ICD-10-CM

## 2018-06-15 ENCOUNTER — Telehealth: Payer: Self-pay | Admitting: Internal Medicine

## 2018-06-15 NOTE — Telephone Encounter (Signed)
Called to schedule Medicare Annual Wellness Visit with the Nurse Health Advisor. No answer at Home phone,  Left message to call Lattie Haw at [phone].   If patient returns call, please note: their last AWV was on 07/07/2016, please schedule AWV with [facility] any date AFTER 07/07/2017.  Thank you! For any questions please contact: Janace Hoard at [phone] or Skype [email or web address]

## 2018-07-11 ENCOUNTER — Other Ambulatory Visit: Payer: Self-pay | Admitting: Internal Medicine

## 2018-08-20 ENCOUNTER — Encounter: Payer: Medicare Other | Admitting: Internal Medicine

## 2018-10-29 ENCOUNTER — Other Ambulatory Visit: Payer: Self-pay | Admitting: Internal Medicine

## 2018-10-29 DIAGNOSIS — J3089 Other allergic rhinitis: Secondary | ICD-10-CM

## 2018-10-29 NOTE — Telephone Encounter (Signed)
Patient does not feel good about coming in due to covid. She will call back later in the year to reschedule.

## 2018-12-03 ENCOUNTER — Ambulatory Visit (INDEPENDENT_AMBULATORY_CARE_PROVIDER_SITE_OTHER): Payer: Medicare Other | Admitting: Internal Medicine

## 2018-12-03 ENCOUNTER — Other Ambulatory Visit: Payer: Self-pay

## 2018-12-03 ENCOUNTER — Encounter: Payer: Self-pay | Admitting: Internal Medicine

## 2018-12-03 VITALS — BP 126/70 | HR 74 | Ht 63.0 in | Wt 203.0 lb

## 2018-12-03 DIAGNOSIS — Z1231 Encounter for screening mammogram for malignant neoplasm of breast: Secondary | ICD-10-CM

## 2018-12-03 DIAGNOSIS — I10 Essential (primary) hypertension: Secondary | ICD-10-CM

## 2018-12-03 DIAGNOSIS — J3089 Other allergic rhinitis: Secondary | ICD-10-CM

## 2018-12-03 DIAGNOSIS — F411 Generalized anxiety disorder: Secondary | ICD-10-CM

## 2018-12-03 DIAGNOSIS — E785 Hyperlipidemia, unspecified: Secondary | ICD-10-CM | POA: Diagnosis not present

## 2018-12-03 DIAGNOSIS — Z0000 Encounter for general adult medical examination without abnormal findings: Secondary | ICD-10-CM

## 2018-12-03 LAB — POCT URINALYSIS DIPSTICK
Bilirubin, UA: NEGATIVE
Blood, UA: NEGATIVE
Glucose, UA: NEGATIVE
Ketones, UA: NEGATIVE
Leukocytes, UA: NEGATIVE
Nitrite, UA: NEGATIVE
Protein, UA: NEGATIVE
Spec Grav, UA: 1.02 (ref 1.010–1.025)
Urobilinogen, UA: 0.2 E.U./dL
pH, UA: 6 (ref 5.0–8.0)

## 2018-12-03 MED ORDER — FLUTICASONE PROPIONATE 50 MCG/ACT NA SUSP: NASAL | 12 refills | Status: AC

## 2018-12-03 NOTE — Progress Notes (Signed)
Date:  12/03/2018   Name:  Joanne Benton   DOB:  02/01/1946   MRN:  [id number]   Chief Complaint: Annual Exam (Breast Exam.) Joanne Benton is a 73 y.o. female who presents today for her Complete Annual Exam. She feels well. She reports exercising very little. She reports she is sleeping well. She denies breast issues.  Mammogram  03/2018 Colonoscopy  09/2016 DEXA  2005 Immunizations complete  Hypertension This is a chronic problem. The problem is controlled (at home 120/80). Associated symptoms include anxiety. Pertinent negatives include no chest pain, headaches, palpitations or shortness of breath. There are no associated agents to hypertension. There are no known risk factors for coronary artery disease. Past treatments include diuretics. The current treatment provides significant improvement. There are no compliance problems.   Anxiety Presents for follow-up visit. Patient reports no chest pain, depressed mood, dizziness, excessive worry, insomnia, nervous/anxious behavior, palpitations or shortness of breath. Symptoms occur rarely. The quality of sleep is good.     Lab Results  Component Value Date   CHOL 289 (H) 08/14/2017   HDL 45 08/14/2017   LDLCALC 194 (H) 08/14/2017   LDLDIRECT 167.0 02/10/2014   TRIG 250 (H) 08/14/2017   CHOLHDL 6.4 (H) 08/14/2017   Lab Results  Component Value Date   CREATININE 0.94 08/14/2017   BUN 17 08/14/2017   NA 144 08/14/2017   K 3.8 08/14/2017   CL 100 08/14/2017   CO2 26 08/14/2017   Lab Results  Component Value Date   TSH 1.870 08/14/2017     Review of Systems  Constitutional: Negative for chills, fatigue and fever.  HENT: Positive for congestion, postnasal drip and sinus pressure. Negative for hearing loss, tinnitus, trouble swallowing and voice change.   Eyes: Negative for visual disturbance.  Respiratory: Negative for cough, chest tightness, shortness of breath and wheezing.   Cardiovascular: Negative for chest pain,  palpitations and leg swelling.  Gastrointestinal: Negative for abdominal pain, constipation, diarrhea and vomiting.  Endocrine: Negative for polydipsia and polyuria.  Genitourinary: Negative for dysuria, frequency, genital sores, vaginal bleeding and vaginal discharge.  Musculoskeletal: Positive for arthralgias (knee OA). Negative for gait problem and joint swelling.  Skin: Negative for color change and rash.  Neurological: Negative for dizziness, tremors, light-headedness and headaches.  Hematological: Negative for adenopathy. Does not bruise/bleed easily.  Psychiatric/Behavioral: Negative for dysphoric mood and sleep disturbance. The patient is not nervous/anxious and does not have insomnia.     Patient Active Problem List   Diagnosis Date Noted  . Personal history of colonic polyps   . Benign neoplasm of descending colon   . Benign neoplasm of ascending colon   . Benign neoplasm of transverse colon   . Generalized anxiety disorder 01/04/2016  . Status post total right knee replacement 01/24/2015  . Nocturnal muscle cramps 12/03/2012  . Alopecia areata 06/25/2012  . Bradycardia 03/26/2012  . Hx of adenomatous colonic polyps   . Overweight (BMI 25.0-29.9) 04/14/2011  . Lumbago without sciatica 04/14/2011  . Essential hypertension 04/14/2011  . Hyperlipidemia with target LDL less than 100 04/14/2011    Allergies  Allergen Reactions  . Oxycodone Nausea Only  . Statins Other (See Comments)    Muscle spasms    Past Surgical History:  Procedure Laterality Date  . ABDOMINAL HYSTERECTOMY  1978   secondary to IUD complicaiton   . COLONOSCOPY    . COLONOSCOPY WITH PROPOFOL N/A 09/16/2016   Procedure: COLONOSCOPY WITH PROPOFOL;  Surgeon: Lucilla Lame,  MD;  Location: [facility];  Service: Endoscopy;  Laterality: N/A;  . epidermal inclusion cyst excision of index finger Right 11/2015  . FINGER MASS EXCISION Right 11/2015   2nd digit  . POLYPECTOMY  09/16/2016   Procedure:  POLYPECTOMY;  Surgeon: Lucilla Lame, MD;  Location: [place];  Service: Endoscopy;;  . REPLACEMENT TOTAL KNEE  August 2012   Right , [place]    Social History   Tobacco Use  . Smoking status: Never Smoker  . Smokeless tobacco: Never Used  Substance Use Topics  . Alcohol use: No  . Drug use: No     Medication list has been reviewed and updated.  Current Meds  Medication Sig  . Acetaminophen (TYLENOL ARTHRITIS PAIN PO) Take 600 mg by mouth as needed.  Marland Kitchen Avocado Oil OIL by Does not apply route.  Rolena Infante [place] 450 MG CAPS Take by mouth.  . fluticasone (FLONASE) 50 MCG/ACT nasal spray USE 2 SPRAYS INTO EACH NOSTRIL ONCE DAILY  . hydrochlorothiazide (HYDRODIURIL) 25 MG tablet TAKE 1 TABLET BY MOUTH ONCE DAILY  . Multiple Vitamins-Minerals (CENTRUM PO) Take by mouth.  Marland Kitchen PARoxetine (PAXIL) 10 MG tablet TAKE 1 TABLET BY MOUTH ONCE DAILY    PHQ 2/9 Scores 12/03/2018 09/14/2017 08/14/2017 07/07/2016  PHQ - 2 Score 0 0 0 0  PHQ- 9 Score 2 - - -    BP Readings from Last 3 Encounters:  12/03/18 126/70  09/14/17 138/86  08/23/17 124/82    Physical Exam Vitals signs and nursing note reviewed.  Constitutional:      General: She is not in acute distress.    Appearance: She is well-developed.  HENT:     Head: Normocephalic and atraumatic.     Right Ear: Tympanic membrane and ear canal normal.     Left Ear: Tympanic membrane and ear canal normal.     Nose:     Right Sinus: No maxillary sinus tenderness.     Left Sinus: No maxillary sinus tenderness.  Eyes:     General: No scleral icterus.       Right eye: No discharge.        Left eye: No discharge.     Conjunctiva/sclera: Conjunctivae normal.  Neck:     Musculoskeletal: Normal range of motion. No erythema.     Thyroid: No thyromegaly.     Vascular: No carotid bruit.  Cardiovascular:     Rate and Rhythm: Normal rate and regular rhythm.     Pulses: Normal pulses.     Heart sounds: Normal heart sounds.  Pulmonary:      Effort: Pulmonary effort is normal. No respiratory distress.     Breath sounds: No wheezing.  Chest:     Breasts:        Right: No mass, nipple discharge, skin change or tenderness.        Left: No mass, nipple discharge, skin change or tenderness.  Abdominal:     General: Bowel sounds are normal.     Palpations: Abdomen is soft.     Tenderness: There is no abdominal tenderness.  Musculoskeletal: Normal range of motion.     Right lower leg: No edema.     Left lower leg: No edema.  Lymphadenopathy:     Cervical: No cervical adenopathy.  Skin:    General: Skin is warm and dry.     Capillary Refill: Capillary refill takes less than 2 seconds.     Findings: No rash.  Neurological:  General: No focal deficit present.     Mental Status: She is alert and oriented to person, place, and time.     Cranial Nerves: No cranial nerve deficit.     Sensory: No sensory deficit.     Deep Tendon Reflexes: Reflexes are normal and symmetric.  Psychiatric:        Attention and Perception: Attention normal.        Speech: Speech normal.        Behavior: Behavior normal.        Thought Content: Thought content normal.     Wt Readings from Last 3 Encounters:  12/03/18 203 lb (92.1 kg)  09/14/17 192 lb (87.1 kg)  08/23/17 176 lb 12.8 oz (80.2 kg)    BP 126/70   Pulse 74   Ht 5\' 3"  (1.6 m)   Wt 203 lb (92.1 kg)   SpO2 98%   BMI 35.96 kg/m   Assessment and Plan: 1. Annual physical exam Normal exam except for BMI Discussed exercise and reduced calorie diet - POCT urinalysis dipstick  2. Encounter for screening mammogram for breast cancer To be scheduled at [place]; Future  3. Essential hypertension Clinically stable but with recent weight gain contributing to risk Pt reminded to follow a low sodium diet and resume exercise when able No worrisome sx of SOB, chest pain, PND or orthopnea. Continue  HCTZ daily - CBC with Differential/Platelet -  Comprehensive metabolic panel  4. Generalized anxiety disorder Pt has been on medication for chronic symptoms for years - she has been unable to wean off without recurrence of anxiety/panic attacks Continue Paxil 10 mg daily without does change at this time - TSH  5. Hyperlipidemia with target LDL less than 100 CAD risk is elevated but has not been able to tolerate statins in the past She continues on supplements and is reminded to exercise regularly and limit dietary fats Will advise if medication trial is needed - Lipid panel  6. Environmental and seasonal allergies Mild daily allergic sx of congestion and PND are controlled with use of flonase No recent s/s of infection such as yellow discharge, ear pain, tooth pain or fever Continue daily flonase - fluticasone (FLONASE) 50 MCG/ACT nasal spray; USE 2 SPRAYS INTO EACH NOSTRIL ONCE DAILY  Dispense: 16 g; Refill: 12   Partially dictated using Editor, commissioning. Any errors are unintentional.  Halina Maidens, MD [facility]  12/03/2018

## 2018-12-04 LAB — CBC WITH DIFFERENTIAL/PLATELET
Basophils Absolute: 0.1 10*3/uL (ref 0.0–0.2)
Basos: 1 %
EOS (ABSOLUTE): 0.1 10*3/uL (ref 0.0–0.4)
Eos: 1 %
Hematocrit: 40.7 % (ref 34.0–46.6)
Hemoglobin: 13.6 g/dL (ref 11.1–15.9)
Immature Grans (Abs): 0 10*3/uL (ref 0.0–0.1)
Immature Granulocytes: 1 %
Lymphocytes Absolute: 1.8 10*3/uL (ref 0.7–3.1)
Lymphs: 32 %
MCH: 31.6 pg (ref 26.6–33.0)
MCHC: 33.4 g/dL (ref 31.5–35.7)
MCV: 95 fL (ref 79–97)
Monocytes Absolute: 0.5 10*3/uL (ref 0.1–0.9)
Monocytes: 9 %
Neutrophils Absolute: 3.2 10*3/uL (ref 1.4–7.0)
Neutrophils: 56 %
Platelets: 246 10*3/uL (ref 150–450)
RBC: 4.3 x10E6/uL (ref 3.77–5.28)
RDW: 13.1 % (ref 11.7–15.4)
WBC: 5.6 10*3/uL (ref 3.4–10.8)

## 2018-12-04 LAB — LIPID PANEL
Chol/HDL Ratio: 6.2 ratio — ABNORMAL HIGH (ref 0.0–4.4)
Cholesterol, Total: 241 mg/dL — ABNORMAL HIGH (ref 100–199)
HDL: 39 mg/dL — ABNORMAL LOW (ref >39)
LDL Calculated: 145 mg/dL — ABNORMAL HIGH (ref 0–99)
Triglycerides: 284 mg/dL — ABNORMAL HIGH (ref 0–149)
VLDL Cholesterol Cal: 57 mg/dL — ABNORMAL HIGH (ref 5–40)

## 2018-12-04 LAB — COMPREHENSIVE METABOLIC PANEL
ALT: 36 IU/L — ABNORMAL HIGH (ref 0–32)
AST: 31 IU/L (ref 0–40)
Albumin/Globulin Ratio: 1.7 (ref 1.2–2.2)
Albumin: 4.1 g/dL (ref 3.8–4.8)
Alkaline Phosphatase: 56 IU/L (ref 39–117)
BUN/Creatinine Ratio: 14 (ref 12–28)
BUN: 13 mg/dL (ref 8–27)
Bilirubin Total: 0.5 mg/dL (ref 0.0–1.2)
CO2: 24 mmol/L (ref 20–29)
Calcium: 9.3 mg/dL (ref 8.7–10.3)
Chloride: 101 mmol/L (ref 96–106)
Creatinine, Ser: 0.95 mg/dL (ref 0.57–1.00)
GFR calc Af Amer: 70 mL/min/{1.73_m2} (ref >59)
GFR calc non Af Amer: 61 mL/min/{1.73_m2} (ref >59)
Globulin, Total: 2.4 g/dL (ref 1.5–4.5)
Glucose: 97 mg/dL (ref 65–99)
Potassium: 3.7 mmol/L (ref 3.5–5.2)
Sodium: 140 mmol/L (ref 134–144)
Total Protein: 6.5 g/dL (ref 6.0–8.5)

## 2018-12-04 LAB — TSH: TSH: 1.97 u[IU]/mL (ref 0.450–4.500)

## 2019-04-26 ENCOUNTER — Other Ambulatory Visit: Payer: Self-pay | Admitting: Internal Medicine

## 2019-04-29 ENCOUNTER — Ambulatory Visit
Admission: RE | Admit: 2019-04-29 | Discharge: 2019-04-29 | Disposition: A | Discharge status: Home / Self Care | Payer: Medicare PPO | Source: Ambulatory Visit | Attending: Internal Medicine | Admitting: Internal Medicine

## 2019-04-29 DIAGNOSIS — Z1231 Encounter for screening mammogram for malignant neoplasm of breast: Secondary | ICD-10-CM | POA: Diagnosis not present

## 2019-05-17 ENCOUNTER — Ambulatory Visit: Payer: Medicare PPO | Admitting: Internal Medicine

## 2019-05-17 ENCOUNTER — Other Ambulatory Visit: Payer: Self-pay

## 2019-05-17 ENCOUNTER — Encounter: Payer: Self-pay | Admitting: Internal Medicine

## 2019-05-17 VITALS — BP 124/84 | HR 75 | Temp 98.6°F | Ht 63.0 in | Wt 202.0 lb

## 2019-05-17 DIAGNOSIS — I10 Essential (primary) hypertension: Secondary | ICD-10-CM

## 2019-05-17 DIAGNOSIS — F411 Generalized anxiety disorder: Secondary | ICD-10-CM | POA: Diagnosis not present

## 2019-05-17 MED ORDER — PAROXETINE HCL 20 MG PO TABS: 20.0000 mg | ORAL_TABLET | Freq: Every day | ORAL | 1 refills | Status: DC

## 2019-05-17 NOTE — Progress Notes (Signed)
Date:  05/17/2019   Name:  Joanne Benton   DOB:  1945/07/26   MRN:  [id number]   Chief Complaint: Hypertension (6 month follow up. ) and Anxiety (10- GAD7- Wants to discuss increasing Paxil. )  Hypertension This is a chronic problem. The problem is controlled. Associated symptoms include anxiety and shortness of breath. Pertinent negatives include no chest pain, headaches or palpitations. There are no known risk factors for coronary artery disease. Past treatments include diuretics. The current treatment provides significant improvement. There are no compliance problems (but sometimes her BP runs low at home).   Anxiety Presents for follow-up visit. Symptoms include compulsions, excessive worry, irritability, nervous/anxious behavior and shortness of breath. Patient reports no chest pain, dizziness, insomnia or palpitations. Symptoms occur most days. The severity of symptoms is causing significant distress. The quality of sleep is fair. Nighttime awakenings: one to two.   Compliance with medications is 76-100% (has been on Paxil 10 for 25 years).    Lab Results  Component Value Date   CREATININE 0.95 12/03/2018   BUN 13 12/03/2018   NA 140 12/03/2018   K 3.7 12/03/2018   CL 101 12/03/2018   CO2 24 12/03/2018   Lab Results  Component Value Date   CHOL 241 (H) 12/03/2018   HDL 39 (L) 12/03/2018   LDLCALC 145 (H) 12/03/2018   LDLDIRECT 167.0 02/10/2014   TRIG 284 (H) 12/03/2018   CHOLHDL 6.2 (H) 12/03/2018   Lab Results  Component Value Date   TSH 1.970 12/03/2018   Lab Results  Component Value Date   HGBA1C 5.0 11/30/2015     Review of Systems  Constitutional: Positive for irritability. Negative for chills, fatigue and fever.  Respiratory: Positive for shortness of breath. Negative for cough, chest tightness and wheezing.   Cardiovascular: Negative for chest pain, palpitations and leg swelling.  Gastrointestinal: Negative for abdominal pain.  Neurological: Negative for  dizziness, light-headedness and headaches.  Psychiatric/Behavioral: The patient is nervous/anxious. The patient does not have insomnia.     Patient Active Problem List   Diagnosis Date Noted  . Generalized anxiety disorder 01/04/2016  . Status post total right knee replacement 01/24/2015  . Nocturnal muscle cramps 12/03/2012  . Alopecia areata 06/25/2012  . Bradycardia 03/26/2012  . Hx of adenomatous colonic polyps   . Overweight (BMI 25.0-29.9) 04/14/2011  . Lumbago without sciatica 04/14/2011  . Essential hypertension 04/14/2011  . Hyperlipidemia with target LDL less than 100 04/14/2011    Allergies  Allergen Reactions  . Oxycodone Nausea Only  . Statins Other (See Comments)    Muscle spasms    Past Surgical History:  Procedure Laterality Date  . ABDOMINAL HYSTERECTOMY  1978   secondary to IUD complicaiton   . COLONOSCOPY    . COLONOSCOPY WITH PROPOFOL N/A 09/16/2016   Procedure: COLONOSCOPY WITH PROPOFOL;  Surgeon: Lucilla Lame, MD;  Location: [place];  Service: Endoscopy;  Laterality: N/A;  . epidermal inclusion cyst excision of index finger Right 11/2015  . FINGER MASS EXCISION Right 11/2015   2nd digit  . POLYPECTOMY  09/16/2016   Procedure: POLYPECTOMY;  Surgeon: Lucilla Lame, MD;  Location: [place];  Service: Endoscopy;;  . REPLACEMENT TOTAL KNEE  August 2012   Right , [place]    Social History   Tobacco Use  . Smoking status: Never Smoker  . Smokeless tobacco: Never Used  Substance Use Topics  . Alcohol use: No  . Drug use: No  Medication list has been reviewed and updated.  Current Meds  Medication Sig  . Acetaminophen (TYLENOL ARTHRITIS PAIN PO) Take 600 mg by mouth as needed.  Marland Kitchen Avocado Oil OIL by Does not apply route.  Rolena Infante [place] 450 MG CAPS Take by mouth.  . fluticasone (FLONASE) 50 MCG/ACT nasal spray USE 2 SPRAYS INTO EACH NOSTRIL ONCE DAILY  . hydrochlorothiazide (HYDRODIURIL) 25 MG tablet TAKE 1 TABLET BY MOUTH  ONCE DAILY  . Multiple Vitamins-Minerals (CENTRUM PO) Take by mouth.  Marland Kitchen PARoxetine (PAXIL) 10 MG tablet TAKE 1 TABLET BY MOUTH ONCE DAILY    PHQ 2/9 Scores 05/17/2019 12/03/2018 09/14/2017 08/14/2017  PHQ - 2 Score 0 0 0 0  PHQ- 9 Score 4 2 - -   GAD 7 : Generalized Anxiety Score 05/17/2019  Nervous, Anxious, on Edge 3  Control/stop worrying 0  Worry too much - different things 0  Trouble relaxing 1  Restless 3  Easily annoyed or irritable 3  Afraid - awful might happen 0  Total GAD 7 Score 10  Anxiety Difficulty Very difficult     BP Readings from Last 3 Encounters:  05/17/19 124/84  12/03/18 126/70  09/14/17 138/86    Physical Exam Constitutional:      Appearance: Normal appearance.  Cardiovascular:     Rate and Rhythm: Normal rate and regular rhythm.     Pulses: Normal pulses.  Pulmonary:     Effort: Pulmonary effort is normal.     Breath sounds: No wheezing, rhonchi or rales.  Musculoskeletal:        General: Normal range of motion.     Cervical back: Normal range of motion.     Right lower leg: No edema.     Left lower leg: No edema.  Skin:    General: Skin is warm.  Neurological:     Mental Status: She is alert.  Psychiatric:        Attention and Perception: Attention normal.        Mood and Affect: Affect normal. Mood is anxious.        Speech: Speech normal.        Cognition and Memory: Cognition normal.        Judgment: Judgment normal.     Wt Readings from Last 3 Encounters:  05/17/19 202 lb (91.6 kg)  12/03/18 203 lb (92.1 kg)  09/14/17 192 lb (87.1 kg)    BP 124/84   Pulse (!) 52   Temp 98.6 F (37 C) (Oral)   Ht 5\' 3"  (1.6 m)   Wt 202 lb (91.6 kg)   SpO2 96%   BMI 35.78 kg/m   Assessment and Plan: 1. Essential hypertension Clinically stable exam with well controlled BP on HCTZ. Tolerating medications without side effects at this time. Pt to continue current regimen and low sodium diet; benefits of regular exercise as able  discussed.  2. Generalized anxiety disorder Having more anxiety since her covid infection Will increase Paxil to 20 mg per day - PARoxetine (PAXIL) 20 MG tablet; Take 1 tablet (20 mg total) by mouth daily.  Dispense: 90 tablet; Refill: 1   Partially dictated using Editor, commissioning. Any errors are unintentional.  Halina Maidens, MD [facility]  05/17/2019

## 2019-05-20 ENCOUNTER — Ambulatory Visit: Payer: Medicare Other | Admitting: Internal Medicine

## 2019-06-10 ENCOUNTER — Ambulatory Visit: Payer: Medicare Other | Admitting: Internal Medicine

## 2019-12-06 ENCOUNTER — Other Ambulatory Visit: Payer: Self-pay

## 2019-12-06 ENCOUNTER — Ambulatory Visit (INDEPENDENT_AMBULATORY_CARE_PROVIDER_SITE_OTHER): Payer: Medicare PPO | Admitting: Internal Medicine

## 2019-12-06 ENCOUNTER — Encounter: Payer: Self-pay | Admitting: Internal Medicine

## 2019-12-06 VITALS — BP 128/80 | HR 71 | Temp 97.8°F | Ht 63.0 in | Wt 206.0 lb

## 2019-12-06 DIAGNOSIS — E785 Hyperlipidemia, unspecified: Secondary | ICD-10-CM | POA: Diagnosis not present

## 2019-12-06 DIAGNOSIS — Z13820 Encounter for screening for osteoporosis: Secondary | ICD-10-CM | POA: Diagnosis not present

## 2019-12-06 DIAGNOSIS — B354 Tinea corporis: Secondary | ICD-10-CM | POA: Diagnosis not present

## 2019-12-06 DIAGNOSIS — Z1231 Encounter for screening mammogram for malignant neoplasm of breast: Secondary | ICD-10-CM | POA: Diagnosis not present

## 2019-12-06 DIAGNOSIS — R0602 Shortness of breath: Secondary | ICD-10-CM | POA: Diagnosis not present

## 2019-12-06 DIAGNOSIS — F411 Generalized anxiety disorder: Secondary | ICD-10-CM

## 2019-12-06 DIAGNOSIS — Z0000 Encounter for general adult medical examination without abnormal findings: Secondary | ICD-10-CM | POA: Diagnosis not present

## 2019-12-06 DIAGNOSIS — E559 Vitamin D deficiency, unspecified: Secondary | ICD-10-CM

## 2019-12-06 DIAGNOSIS — I10 Essential (primary) hypertension: Secondary | ICD-10-CM | POA: Diagnosis not present

## 2019-12-06 LAB — POCT URINALYSIS DIPSTICK
Bilirubin, UA: NEGATIVE
Blood, UA: NEGATIVE
Glucose, UA: NEGATIVE
Ketones, UA: NEGATIVE
Leukocytes, UA: NEGATIVE
Nitrite, UA: NEGATIVE
Protein, UA: NEGATIVE
Spec Grav, UA: <=1.005 — AB (ref 1.010–1.025)
Urobilinogen, UA: 0.2 E.U./dL
pH, UA: 6.5 (ref 5.0–8.0)

## 2019-12-06 MED ORDER — NYSTATIN 100000 UNIT/GM EX CREA: 1.0000 "application " | TOPICAL_CREAM | Freq: Two times a day (BID) | CUTANEOUS | 0 refills | Status: DC

## 2019-12-06 MED ORDER — FLUCONAZOLE 100 MG PO TABS: 100.0000 mg | ORAL_TABLET | Freq: Every day | ORAL | 0 refills | Status: AC

## 2019-12-06 NOTE — Progress Notes (Signed)
Date:  12/06/2019   Name:  Joanne Benton   DOB:  [birth year]   MRN:  [id number]   Chief Complaint: Annual Exam (breast exam no pap/ goes to [facility] for mam.) and Rash (X3 months, under stomach, red, itches, stays hot and she sweats )  Joanne Benton is a 74 y.o. female who presents today for her Complete Annual Exam. She feels fairly well. She reports exercising none. She reports she is sleeping well. Breast complaints sore spot on left breast.  Mammogram: 04/2019 DEXA: 2005 Colonoscopy: 09/2016 7 polyps - repeat 5 yrs  Immunization History  Administered Date(s) Administered  . Influenza Split 03/07/2011, 12/22/2011, 01/10/2014  . Influenza, High Dose Seasonal PF 12/26/2016  . Influenza-Unspecified 12/07/2015, 12/27/2017, 12/15/2018  . Pneumococcal Conjugate-13 02/13/2015  . Pneumococcal Polysaccharide-23 02/10/2014, 03/17/2017  . Tdap 12/03/2012  . Zoster 02/11/2010  . Zoster Recombinat (Shingrix) 03/17/2017, 06/02/2017    Hypertension This is a chronic problem. The problem is controlled. Associated symptoms include anxiety and shortness of breath (since she had covid in Dec.). Pertinent negatives include no chest pain, headaches or palpitations. Past treatments include diuretics. The current treatment provides significant improvement. There are no compliance problems.   Anxiety Presents for follow-up visit. Symptoms include shortness of breath (since she had covid in Dec.). Patient reports no chest pain, dizziness, nervous/anxious behavior or palpitations. Symptoms occur occasionally. The quality of sleep is good.   Compliance with medications is 76-100% (Paxil 20 mg daily).  Rash This is a new problem. The current episode started more than 1 month ago. The problem is unchanged. The affected locations include the abdomen. The rash is characterized by itchiness and redness. She was exposed to nothing. Associated symptoms include fatigue and shortness of breath (since she  had covid in Dec.). Pertinent negatives include no congestion, cough, diarrhea, fever or vomiting.  Shortness of breath - since Covid infection in January.  More fatigue, less energy to do housework, hot all the time and sweats constantly.  No chest pain. No dizziness. She continues to push herself.  Sleeps fairly well.    Lab Results  Component Value Date   CREATININE 0.95 12/03/2018   BUN 13 12/03/2018   NA 140 12/03/2018   K 3.7 12/03/2018   CL 101 12/03/2018   CO2 24 12/03/2018   Lab Results  Component Value Date   CHOL 241 (H) 12/03/2018   HDL 39 (L) 12/03/2018   LDLCALC 145 (H) 12/03/2018   LDLDIRECT 167.0 02/10/2014   TRIG 284 (H) 12/03/2018   CHOLHDL 6.2 (H) 12/03/2018   Lab Results  Component Value Date   TSH 1.970 12/03/2018   Lab Results  Component Value Date   HGBA1C 5.0 11/30/2015   Lab Results  Component Value Date   WBC 5.6 12/03/2018   HGB 13.6 12/03/2018   HCT 40.7 12/03/2018   MCV 95 12/03/2018   PLT 246 12/03/2018   Lab Results  Component Value Date   ALT 36 (H) 12/03/2018   AST 31 12/03/2018   ALKPHOS 56 12/03/2018   BILITOT 0.5 12/03/2018     Review of Systems  Constitutional: Positive for diaphoresis and fatigue. Negative for chills and fever.  HENT: Negative for congestion, hearing loss, tinnitus, trouble swallowing and voice change.   Eyes: Negative for visual disturbance.  Respiratory: Positive for shortness of breath (since she had covid in Dec.). Negative for cough, chest tightness and wheezing.   Cardiovascular: Negative for chest pain, palpitations and leg  swelling.  Gastrointestinal: Negative for abdominal pain, constipation, diarrhea and vomiting.  Endocrine: Negative for polydipsia and polyuria.  Genitourinary: Negative for dysuria, frequency, genital sores, vaginal bleeding and vaginal discharge.  Musculoskeletal: Negative for arthralgias, gait problem and joint swelling.  Skin: Positive for rash (under stomach ). Negative for  color change.  Neurological: Positive for tremors (hands shake sometimes). Negative for dizziness, light-headedness and headaches.  Hematological: Negative for adenopathy. Does not bruise/bleed easily.  Psychiatric/Behavioral: Negative for dysphoric mood and sleep disturbance. The patient is not nervous/anxious.     Patient Active Problem List   Diagnosis Date Noted  . Generalized anxiety disorder 01/04/2016  . Status post total right knee replacement 01/24/2015  . Nocturnal muscle cramps 12/03/2012  . Alopecia areata 06/25/2012  . Bradycardia 03/26/2012  . Hx of adenomatous colonic polyps   . Overweight (BMI 25.0-29.9) 04/14/2011  . Lumbago without sciatica 04/14/2011  . Essential hypertension 04/14/2011  . Hyperlipidemia with target LDL less than 100 04/14/2011    Allergies  Allergen Reactions  . Oxycodone Nausea Only  . Statins Other (See Comments)    Muscle spasms    Past Surgical History:  Procedure Laterality Date  . ABDOMINAL HYSTERECTOMY  1978   secondary to IUD complicaiton   . COLONOSCOPY    . COLONOSCOPY WITH PROPOFOL N/A 09/16/2016   Procedure: COLONOSCOPY WITH PROPOFOL;  Surgeon: Lucilla Lame, MD;  Location: [place];  Service: Endoscopy;  Laterality: N/A;  . epidermal inclusion cyst excision of index finger Right 11/2015  . FINGER MASS EXCISION Right 11/2015   2nd digit  . POLYPECTOMY  09/16/2016   Procedure: POLYPECTOMY;  Surgeon: Lucilla Lame, MD;  Location: [place];  Service: Endoscopy;;  . REPLACEMENT TOTAL KNEE  August 2012   Right , [place]    Social History   Tobacco Use  . Smoking status: Never Smoker  . Smokeless tobacco: Never Used  Vaping Use  . Vaping Use: Never used  Substance Use Topics  . Alcohol use: No  . Drug use: No     Medication list has been reviewed and updated.  Current Meds  Medication Sig  . Acetaminophen (TYLENOL ARTHRITIS PAIN PO) Take 600 mg by mouth as needed.  Marland Kitchen Avocado Oil OIL by Does not  apply route.  Rolena Infante [place] 450 MG CAPS Take by mouth.  . Cholecalciferol (VITAMIN D3 PO) Take 1 tablet by mouth daily.  . fluticasone (FLONASE) 50 MCG/ACT nasal spray USE 2 SPRAYS INTO EACH NOSTRIL ONCE DAILY  . hydrochlorothiazide (HYDRODIURIL) 25 MG tablet TAKE 1 TABLET BY MOUTH ONCE DAILY  . Multiple Vitamins-Minerals (CENTRUM PO) Take by mouth.    PHQ 2/9 Scores 05/17/2019 12/03/2018 09/14/2017 08/14/2017  PHQ - 2 Score 0 0 0 0  PHQ- 9 Score 4 2 - -    GAD 7 : Generalized Anxiety Score 05/17/2019  Nervous, Anxious, on Edge 3  Control/stop worrying 0  Worry too much - different things 0  Trouble relaxing 1  Restless 3  Easily annoyed or irritable 3  Afraid - awful might happen 0  Total GAD 7 Score 10  Anxiety Difficulty Very difficult    BP Readings from Last 3 Encounters:  12/06/19 128/80  05/17/19 124/84  12/03/18 126/70    Physical Exam Vitals and nursing note reviewed.  Constitutional:      General: She is not in acute distress.    Appearance: She is well-developed.  HENT:     Head: Normocephalic and atraumatic.  Right Ear: Tympanic membrane and ear canal normal.     Left Ear: Tympanic membrane and ear canal normal.     Nose:     Right Sinus: No maxillary sinus tenderness.     Left Sinus: No maxillary sinus tenderness.  Eyes:     General: No scleral icterus.       Right eye: No discharge.        Left eye: No discharge.     Conjunctiva/sclera: Conjunctivae normal.  Neck:     Thyroid: No thyromegaly.     Vascular: No carotid bruit.  Cardiovascular:     Rate and Rhythm: Normal rate and regular rhythm.     Pulses: Normal pulses.     Heart sounds: Normal heart sounds.  Pulmonary:     Effort: Pulmonary effort is normal. No respiratory distress.     Breath sounds: No wheezing.  Chest:     Breasts:        Right: No mass, nipple discharge, skin change or tenderness.        Left: No mass, nipple discharge, skin change or tenderness.  Abdominal:      General: Bowel sounds are normal.     Palpations: Abdomen is soft.     Tenderness: There is no abdominal tenderness.  Musculoskeletal:        General: Normal range of motion.     Cervical back: Normal range of motion. No erythema.     Right lower leg: No edema.     Left lower leg: No edema.  Lymphadenopathy:     Cervical: No cervical adenopathy.  Skin:    General: Skin is warm and dry.     Capillary Refill: Capillary refill takes less than 2 seconds.     Findings: Rash present.     Comments: Tinea rash under both breasts and under abdominal fold  Neurological:     General: No focal deficit present.     Mental Status: She is alert and oriented to person, place, and time.     Cranial Nerves: No cranial nerve deficit.     Sensory: No sensory deficit.     Deep Tendon Reflexes: Reflexes are normal and symmetric.  Psychiatric:        Attention and Perception: Attention normal.        Mood and Affect: Mood normal.        Behavior: Behavior normal.     Wt Readings from Last 3 Encounters:  12/06/19 206 lb (93.4 kg)  05/17/19 202 lb (91.6 kg)  12/03/18 203 lb (92.1 kg)    BP 128/80   Pulse 71   Temp 97.8 F (36.6 C) (Oral)   Ht 5\' 3"  (1.6 m)   Wt 206 lb (93.4 kg)   SpO2 98%   BMI 36.49 kg/m   Assessment and Plan: 1. Annual physical exam Exam is normal except for weight. Encourage regular exercise and appropriate dietary changes.  2. Encounter for screening mammogram for breast cancer - MM 3D SCREEN BREAST BILATERAL; Future  3. Essential hypertension Clinically stable exam with well controlled BP. Tolerating medications without side effects at this time. Pt to continue current regimen and low sodium diet; benefits of regular exercise as able discussed. - CBC with Differential/Platelet - Comprehensive metabolic panel - TSH - POCT urinalysis dipstick  4. Hyperlipidemia with target LDL less than 100 Continue to work with diet - Lipid panel  5. Generalized anxiety  disorder Doing very well on paxil daily  6. Tinea  corporis - fluconazole (DIFLUCAN) 100 MG tablet; Take 1 tablet (100 mg total) by mouth daily for 7 days.  Dispense: 7 tablet; Refill: 0 - nystatin cream (MYCOSTATIN); Apply 1 application topically 2 (two) times daily. To rash under breasts and abdomen  Dispense: 30 g; Refill: 0  7. Vitamin D deficiency disease Check labs and advise - currently taking 3000 IU daily - VITAMIN D 25 Hydroxy (Vit-D Deficiency, Fractures)  8. Encounter for screening for osteoporosis - DG Bone Density; Future  9. Shortness of breath Post covid Will give sample of Breo - one puff daily   Partially dictated using Editor, commissioning. Any errors are unintentional.  Halina Maidens, MD [facility]  12/06/2019

## 2019-12-07 LAB — CBC WITH DIFFERENTIAL/PLATELET
Basophils Absolute: 0.1 10*3/uL (ref 0.0–0.2)
Basos: 1 %
EOS (ABSOLUTE): 0.1 10*3/uL (ref 0.0–0.4)
Eos: 1 %
Hematocrit: 41.9 % (ref 34.0–46.6)
Hemoglobin: 13.9 g/dL (ref 11.1–15.9)
Immature Grans (Abs): 0.1 10*3/uL (ref 0.0–0.1)
Immature Granulocytes: 1 %
Lymphocytes Absolute: 2.2 10*3/uL (ref 0.7–3.1)
Lymphs: 38 %
MCH: 32.4 pg (ref 26.6–33.0)
MCHC: 33.2 g/dL (ref 31.5–35.7)
MCV: 98 fL — ABNORMAL HIGH (ref 79–97)
Monocytes Absolute: 0.6 10*3/uL (ref 0.1–0.9)
Monocytes: 10 %
Neutrophils Absolute: 2.8 10*3/uL (ref 1.4–7.0)
Neutrophils: 49 %
Platelets: 245 10*3/uL (ref 150–450)
RBC: 4.29 x10E6/uL (ref 3.77–5.28)
RDW: 13 % (ref 11.7–15.4)
WBC: 5.8 10*3/uL (ref 3.4–10.8)

## 2019-12-07 LAB — COMPREHENSIVE METABOLIC PANEL
ALT: 35 IU/L — ABNORMAL HIGH (ref 0–32)
AST: 33 IU/L (ref 0–40)
Albumin/Globulin Ratio: 1.8 (ref 1.2–2.2)
Albumin: 4.2 g/dL (ref 3.7–4.7)
Alkaline Phosphatase: 55 IU/L (ref 48–121)
BUN/Creatinine Ratio: 17 (ref 12–28)
BUN: 17 mg/dL (ref 8–27)
Bilirubin Total: 0.6 mg/dL (ref 0.0–1.2)
CO2: 25 mmol/L (ref 20–29)
Calcium: 9.1 mg/dL (ref 8.7–10.3)
Chloride: 100 mmol/L (ref 96–106)
Creatinine, Ser: 0.98 mg/dL (ref 0.57–1.00)
GFR calc Af Amer: 67 mL/min/{1.73_m2} (ref >59)
GFR calc non Af Amer: 58 mL/min/{1.73_m2} — ABNORMAL LOW (ref >59)
Globulin, Total: 2.3 g/dL (ref 1.5–4.5)
Glucose: 99 mg/dL (ref 65–99)
Potassium: 3.7 mmol/L (ref 3.5–5.2)
Sodium: 139 mmol/L (ref 134–144)
Total Protein: 6.5 g/dL (ref 6.0–8.5)

## 2019-12-07 LAB — LIPID PANEL
Chol/HDL Ratio: 6 ratio — ABNORMAL HIGH (ref 0.0–4.4)
Cholesterol, Total: 245 mg/dL — ABNORMAL HIGH (ref 100–199)
HDL: 41 mg/dL (ref >39)
LDL Chol Calc (NIH): 160 mg/dL — ABNORMAL HIGH (ref 0–99)
Triglycerides: 237 mg/dL — ABNORMAL HIGH (ref 0–149)
VLDL Cholesterol Cal: 44 mg/dL — ABNORMAL HIGH (ref 5–40)

## 2019-12-07 LAB — TSH: TSH: 1.45 u[IU]/mL (ref 0.450–4.500)

## 2019-12-07 LAB — VITAMIN D 25 HYDROXY (VIT D DEFICIENCY, FRACTURES): Vit D, 25-Hydroxy: 28.4 ng/mL — ABNORMAL LOW (ref 30.0–100.0)

## 2019-12-09 ENCOUNTER — Telehealth: Payer: Self-pay | Admitting: Internal Medicine

## 2019-12-09 ENCOUNTER — Other Ambulatory Visit: Payer: Self-pay | Admitting: Internal Medicine

## 2019-12-09 DIAGNOSIS — B354 Tinea corporis: Secondary | ICD-10-CM

## 2019-12-09 DIAGNOSIS — E559 Vitamin D deficiency, unspecified: Secondary | ICD-10-CM

## 2019-12-09 MED ORDER — VITAMIN D (ERGOCALCIFEROL) 1.25 MG (50000 UNIT) PO CAPS: 50000.0000 [IU] | ORAL_CAPSULE | ORAL | 12 refills | Status: DC

## 2019-12-09 MED ORDER — KETOCONAZOLE 2 % EX CREA: 1.0000 "application " | TOPICAL_CREAM | Freq: Every day | CUTANEOUS | 0 refills | Status: DC

## 2019-12-09 NOTE — Telephone Encounter (Signed)
Spoke with patient about labs.  CM

## 2019-12-09 NOTE — Telephone Encounter (Unsigned)
Copied from [place] [phone]. Topic: General - Inquiry >> Dec 09, 2019  8:47 AM Mathis Bud wrote: Reason for CRM: patient is requesting a nurse to call back regarding her lab results.  Patient states PCP nurse left message to call back regarding her results. Call back [phone]

## 2019-12-16 ENCOUNTER — Other Ambulatory Visit: Payer: Self-pay | Admitting: Internal Medicine

## 2019-12-16 DIAGNOSIS — F411 Generalized anxiety disorder: Secondary | ICD-10-CM

## 2019-12-24 ENCOUNTER — Other Ambulatory Visit: Payer: Self-pay | Admitting: Internal Medicine

## 2019-12-24 ENCOUNTER — Telehealth: Payer: Self-pay | Admitting: Internal Medicine

## 2019-12-24 DIAGNOSIS — B354 Tinea corporis: Secondary | ICD-10-CM

## 2019-12-24 NOTE — Telephone Encounter (Signed)
Requested medication (s) are due for refill today: yes  Requested medication (s) are on the active medication list: yes  Last refill:  12/09/19 (cream prescribed, pharmacy requesting powder instead)  Future visit scheduled: yes  Notes to clinic:  To pharmacy: !!!SHE IS NEEDEING POWDER INSTEAD OF CREAM PLEASE !!!    Requested Prescriptions  Pending Prescriptions Disp Refills   ketoconazole (NIZORAL) 2 % cream [Pharmacy Med Name: KETOCONAZOLE 2% TOP CREAM GM] 30 g     Sig: APPLY TO AFFECTED AREA(s) OF RASH UNDER BREASTS AND ABDOMEN AS DIRECTED      Over the Counter:  OTC Passed - 12/24/2019 10:33 AM      Passed - Valid encounter within last 12 months    Recent Outpatient Visits           2 weeks ago Annual physical exam   [facility] Glean Hess, MD   7 months ago Essential hypertension   [facility] Glean Hess, MD   1 year ago Annual physical exam   [facility] Glean Hess, MD   2 years ago [facility] Glean Hess, MD   2 years ago Acute non-recurrent maxillary sinusitis   [facility] Glean Hess, MD       Future Appointments             In 5 months Army Melia Jesse Sans, MD [facility], [place]   In 11 months Army Melia Jesse Sans, MD [facility], [facility]

## 2019-12-24 NOTE — Telephone Encounter (Signed)
Copied from [place] [phone]. Topic: General - Inquiry >> Dec 24, 2019  2:39 PM Gillis Ends D wrote: Reason for CRM: Patient would like a callback from Dr. Army Melia or Ov[place] her nurse. She said that she has tried a medication that was prescription but it hasn't cleared up her problem and would like something else. Please call [phone]. Please advise the patient.

## 2019-12-25 ENCOUNTER — Other Ambulatory Visit: Payer: Self-pay | Admitting: Internal Medicine

## 2019-12-25 DIAGNOSIS — R0602 Shortness of breath: Secondary | ICD-10-CM

## 2019-12-25 MED ORDER — BREO ELLIPTA 100-25 MCG/INH IN AEPB: 1.0000 | INHALATION_SPRAY | Freq: Every day | RESPIRATORY_TRACT | 5 refills | Status: DC

## 2019-12-25 NOTE — Telephone Encounter (Signed)
She needs to see Dermatology.  She has tried the fluconazole pills and now 2 creams.  Call her to see if she is no better, or just not completely better.

## 2019-12-25 NOTE — Telephone Encounter (Signed)
Called pt let her know that an Breo inhaler was sent to [facility]. Pt verbalized understanding.  KP

## 2019-12-25 NOTE — Telephone Encounter (Signed)
Breo sent to [facility] drug

## 2019-12-25 NOTE — Telephone Encounter (Signed)
Pt said her rash is still red and itches. She said it dries out and skin pills and then it comes back.  Pt got a sample of a Breo ellipta inhaler she said she wanted to let you know that it did help and she wanted a RF sent in. She has a dermatologist that she sees she will call and see if she can get an appt with them.  KP

## 2019-12-25 NOTE — Telephone Encounter (Signed)
Pt states she is using two creams for her rash but they are not helping with the rash and pt asked for something stronger. Or advise on what to do/please advise   Pt also stated she was given a sample inhaler and wanted to let Dr. Army Melia know that it was working and Pt would like the Rx called in for it/ please advise

## 2019-12-25 NOTE — Telephone Encounter (Signed)
Please Advise. Pt is taking Ketoconazole powder for a rash. Pt said its not working.  KP

## 2019-12-25 NOTE — Telephone Encounter (Signed)
Pt said her rash is still red and itches. She said it dries out and skin pills and then it comes back.   KP

## 2019-12-27 DIAGNOSIS — R21 Rash and other nonspecific skin eruption: Secondary | ICD-10-CM | POA: Diagnosis not present

## 2019-12-27 DIAGNOSIS — L409 Psoriasis, unspecified: Secondary | ICD-10-CM | POA: Diagnosis not present

## 2020-01-09 DIAGNOSIS — H2513 Age-related nuclear cataract, bilateral: Secondary | ICD-10-CM | POA: Diagnosis not present

## 2020-01-14 DIAGNOSIS — Z96651 Presence of right artificial knee joint: Secondary | ICD-10-CM | POA: Diagnosis not present

## 2020-03-16 ENCOUNTER — Other Ambulatory Visit: Payer: Self-pay | Admitting: Internal Medicine

## 2020-03-16 DIAGNOSIS — F411 Generalized anxiety disorder: Secondary | ICD-10-CM

## 2020-04-07 DIAGNOSIS — M5137 Other intervertebral disc degeneration, lumbosacral region: Secondary | ICD-10-CM | POA: Diagnosis not present

## 2020-04-07 DIAGNOSIS — M9904 Segmental and somatic dysfunction of sacral region: Secondary | ICD-10-CM | POA: Diagnosis not present

## 2020-04-07 DIAGNOSIS — M9901 Segmental and somatic dysfunction of cervical region: Secondary | ICD-10-CM | POA: Diagnosis not present

## 2020-04-07 DIAGNOSIS — M9903 Segmental and somatic dysfunction of lumbar region: Secondary | ICD-10-CM | POA: Diagnosis not present

## 2020-04-07 DIAGNOSIS — M5413 Radiculopathy, cervicothoracic region: Secondary | ICD-10-CM | POA: Diagnosis not present

## 2020-04-07 DIAGNOSIS — M7918 Myalgia, other site: Secondary | ICD-10-CM | POA: Diagnosis not present

## 2020-04-07 DIAGNOSIS — M5136 Other intervertebral disc degeneration, lumbar region: Secondary | ICD-10-CM | POA: Diagnosis not present

## 2020-04-07 DIAGNOSIS — M50322 Other cervical disc degeneration at C5-C6 level: Secondary | ICD-10-CM | POA: Diagnosis not present

## 2020-04-09 DIAGNOSIS — M5137 Other intervertebral disc degeneration, lumbosacral region: Secondary | ICD-10-CM | POA: Diagnosis not present

## 2020-04-09 DIAGNOSIS — M9904 Segmental and somatic dysfunction of sacral region: Secondary | ICD-10-CM | POA: Diagnosis not present

## 2020-04-09 DIAGNOSIS — M5413 Radiculopathy, cervicothoracic region: Secondary | ICD-10-CM | POA: Diagnosis not present

## 2020-04-09 DIAGNOSIS — M5136 Other intervertebral disc degeneration, lumbar region: Secondary | ICD-10-CM | POA: Diagnosis not present

## 2020-04-09 DIAGNOSIS — M9903 Segmental and somatic dysfunction of lumbar region: Secondary | ICD-10-CM | POA: Diagnosis not present

## 2020-04-09 DIAGNOSIS — M50322 Other cervical disc degeneration at C5-C6 level: Secondary | ICD-10-CM | POA: Diagnosis not present

## 2020-04-09 DIAGNOSIS — M9901 Segmental and somatic dysfunction of cervical region: Secondary | ICD-10-CM | POA: Diagnosis not present

## 2020-04-09 DIAGNOSIS — M7918 Myalgia, other site: Secondary | ICD-10-CM | POA: Diagnosis not present

## 2020-04-09 DIAGNOSIS — M545 Low back pain, unspecified: Secondary | ICD-10-CM | POA: Diagnosis not present

## 2020-04-21 DIAGNOSIS — M5137 Other intervertebral disc degeneration, lumbosacral region: Secondary | ICD-10-CM | POA: Diagnosis not present

## 2020-04-21 DIAGNOSIS — M9904 Segmental and somatic dysfunction of sacral region: Secondary | ICD-10-CM | POA: Diagnosis not present

## 2020-04-21 DIAGNOSIS — M9903 Segmental and somatic dysfunction of lumbar region: Secondary | ICD-10-CM | POA: Diagnosis not present

## 2020-04-21 DIAGNOSIS — M5136 Other intervertebral disc degeneration, lumbar region: Secondary | ICD-10-CM | POA: Diagnosis not present

## 2020-04-21 DIAGNOSIS — M5417 Radiculopathy, lumbosacral region: Secondary | ICD-10-CM | POA: Diagnosis not present

## 2020-04-21 DIAGNOSIS — M5413 Radiculopathy, cervicothoracic region: Secondary | ICD-10-CM | POA: Diagnosis not present

## 2020-04-21 DIAGNOSIS — M9901 Segmental and somatic dysfunction of cervical region: Secondary | ICD-10-CM | POA: Diagnosis not present

## 2020-04-28 ENCOUNTER — Other Ambulatory Visit: Payer: Self-pay | Admitting: Internal Medicine

## 2020-04-29 ENCOUNTER — Ambulatory Visit
Admission: RE | Admit: 2020-04-29 | Discharge: 2020-04-29 | Disposition: A | Discharge status: Home / Self Care | Payer: Medicare PPO | Source: Ambulatory Visit | Attending: Internal Medicine | Admitting: Internal Medicine

## 2020-04-29 ENCOUNTER — Other Ambulatory Visit: Payer: Medicare Other

## 2020-04-29 ENCOUNTER — Other Ambulatory Visit: Payer: Self-pay

## 2020-04-29 ENCOUNTER — Ambulatory Visit: Payer: Medicare Other

## 2020-04-29 DIAGNOSIS — Z13820 Encounter for screening for osteoporosis: Secondary | ICD-10-CM

## 2020-04-29 DIAGNOSIS — Z1231 Encounter for screening mammogram for malignant neoplasm of breast: Secondary | ICD-10-CM | POA: Insufficient documentation

## 2020-04-29 DIAGNOSIS — Z780 Asymptomatic menopausal state: Secondary | ICD-10-CM | POA: Insufficient documentation

## 2020-04-29 DIAGNOSIS — M85851 Other specified disorders of bone density and structure, right thigh: Secondary | ICD-10-CM | POA: Insufficient documentation

## 2020-04-29 DIAGNOSIS — R29890 Loss of height: Secondary | ICD-10-CM | POA: Diagnosis not present

## 2020-05-11 ENCOUNTER — Telehealth: Payer: Self-pay

## 2020-05-11 NOTE — Telephone Encounter (Signed)
Called and spoke with patient and informed her of negative and normal mammogram.

## 2020-05-11 NOTE — Telephone Encounter (Signed)
Copied from [place] [phone]. Topic: General - Other >> May 11, 2020 10:26 AM Celene Kras wrote: Reason for CRM: Pt called and is requesting to have the results from her mammogram. Please advise.

## 2020-06-09 ENCOUNTER — Ambulatory Visit: Payer: Medicare PPO | Admitting: Internal Medicine

## 2020-06-09 ENCOUNTER — Ambulatory Visit
Admission: RE | Admit: 2020-06-09 | Discharge: 2020-06-09 | Disposition: A | Discharge status: Home / Self Care | Payer: Medicare PPO | Source: Ambulatory Visit | Attending: Internal Medicine | Admitting: Internal Medicine

## 2020-06-09 ENCOUNTER — Other Ambulatory Visit: Payer: Self-pay

## 2020-06-09 ENCOUNTER — Encounter: Payer: Self-pay | Admitting: Internal Medicine

## 2020-06-09 ENCOUNTER — Ambulatory Visit
Admission: RE | Admit: 2020-06-09 | Discharge: 2020-06-09 | Disposition: A | Discharge status: Home / Self Care | Payer: Medicare PPO | Attending: Internal Medicine | Admitting: Internal Medicine

## 2020-06-09 VITALS — BP 122/92 | HR 74 | Temp 98.3°F | Ht 63.0 in | Wt 210.0 lb

## 2020-06-09 DIAGNOSIS — R0602 Shortness of breath: Secondary | ICD-10-CM | POA: Diagnosis not present

## 2020-06-09 DIAGNOSIS — F411 Generalized anxiety disorder: Secondary | ICD-10-CM | POA: Diagnosis not present

## 2020-06-09 DIAGNOSIS — I10 Essential (primary) hypertension: Secondary | ICD-10-CM

## 2020-06-09 DIAGNOSIS — R5383 Other fatigue: Secondary | ICD-10-CM | POA: Diagnosis not present

## 2020-06-09 DIAGNOSIS — T148XXA Other injury of unspecified body region, initial encounter: Secondary | ICD-10-CM

## 2020-06-09 MED ORDER — LOSARTAN POTASSIUM 25 MG PO TABS
25.0000 mg | ORAL_TABLET | Freq: Every day | ORAL | 1 refills | Status: DC
Start: 2020-06-09 — End: 2020-09-04

## 2020-06-09 MED ORDER — PAROXETINE HCL 20 MG PO TABS: 20.0000 mg | ORAL_TABLET | Freq: Every day | ORAL | 1 refills | Status: DC

## 2020-06-09 MED ORDER — MUPIROCIN 2 % EX OINT: TOPICAL_OINTMENT | Freq: Two times a day (BID) | CUTANEOUS | 0 refills | Status: DC

## 2020-06-09 NOTE — Progress Notes (Signed)
Date:  06/09/2020   Name:  Joanne Benton   DOB:  [birth year]   MRN:  [id number]   Chief Complaint: Hypertension, Anxiety, and Shortness of Breath (Pt had covid in decemeber 23rd 2020,  still has SOB when walking, fatigue )  Hypertension This is a chronic problem. The problem is controlled. Associated symptoms include anxiety. Pertinent negatives include no chest pain, headaches, palpitations or shortness of breath. Past treatments include diuretics. The current treatment provides significant improvement.  Anxiety Presents for follow-up visit. Patient reports no chest pain, dizziness, nervous/anxious behavior, palpitations or shortness of breath. The quality of sleep is good.   Compliance with medications is 76-100% (paxil).  Shortness of Breath This is a chronic problem. The current episode started more than 1 year ago. The problem occurs constantly. Pertinent negatives include no abdominal pain, chest pain, headaches, leg swelling or wheezing. Exacerbated by: since Covid infection in 2020.    Lab Results  Component Value Date   CREATININE 0.98 12/06/2019   BUN 17 12/06/2019   NA 139 12/06/2019   K 3.7 12/06/2019   CL 100 12/06/2019   CO2 25 12/06/2019   Lab Results  Component Value Date   CHOL 245 (H) 12/06/2019   HDL 41 12/06/2019   LDLCALC 160 (H) 12/06/2019   LDLDIRECT 167.0 02/10/2014   TRIG 237 (H) 12/06/2019   CHOLHDL 6.0 (H) 12/06/2019   Lab Results  Component Value Date   TSH 1.450 12/06/2019   Lab Results  Component Value Date   HGBA1C 5.0 11/30/2015   Lab Results  Component Value Date   WBC 5.8 12/06/2019   HGB 13.9 12/06/2019   HCT 41.9 12/06/2019   MCV 98 (H) 12/06/2019   PLT 245 12/06/2019   Lab Results  Component Value Date   ALT 35 (H) 12/06/2019   AST 33 12/06/2019   ALKPHOS 55 12/06/2019   BILITOT 0.6 12/06/2019     Review of Systems  Constitutional: Negative for fatigue and unexpected weight change.  HENT: Negative for nosebleeds.    Eyes: Negative for visual disturbance.  Respiratory: Negative for cough, chest tightness, shortness of breath and wheezing.   Cardiovascular: Negative for chest pain, palpitations and leg swelling.  Gastrointestinal: Negative for abdominal pain, constipation and diarrhea.  Neurological: Negative for dizziness, weakness, light-headedness and headaches.  Psychiatric/Behavioral: Negative for dysphoric mood and sleep disturbance. The patient is not nervous/anxious.     Patient Active Problem List   Diagnosis Date Noted  . Generalized anxiety disorder 01/04/2016  . Status post total right knee replacement 01/24/2015  . Nocturnal muscle cramps 12/03/2012  . Alopecia areata 06/25/2012  . Bradycardia 03/26/2012  . Hx of adenomatous colonic polyps   . Overweight (BMI 25.0-29.9) 04/14/2011  . Lumbago without sciatica 04/14/2011  . Essential hypertension 04/14/2011  . Hyperlipidemia with target LDL less than 100 04/14/2011    Allergies  Allergen Reactions  . Oxycodone Nausea Only  . Statins Other (See Comments)    Muscle spasms    Past Surgical History:  Procedure Laterality Date  . ABDOMINAL HYSTERECTOMY  1978   secondary to IUD complicaiton   . COLONOSCOPY    . COLONOSCOPY WITH PROPOFOL N/A 09/16/2016   Procedure: COLONOSCOPY WITH PROPOFOL;  Surgeon: Lucilla Lame, MD;  Location: [place];  Service: Endoscopy;  Laterality: N/A;  . epidermal inclusion cyst excision of index finger Right 11/2015  . FINGER MASS EXCISION Right 11/2015   2nd digit  . POLYPECTOMY  09/16/2016  Procedure: POLYPECTOMY;  Surgeon: Lucilla Lame, MD;  Location: [place];  Service: Endoscopy;;  . REPLACEMENT TOTAL KNEE  August 2012   Right , [place]    Social History   Tobacco Use  . Smoking status: Never Smoker  . Smokeless tobacco: Never Used  Vaping Use  . Vaping Use: Never used  Substance Use Topics  . Alcohol use: No  . Drug use: No     Medication list has been reviewed and  updated.  Current Meds  Medication Sig  . Acetaminophen (TYLENOL ARTHRITIS PAIN PO) Take 600 mg by mouth as needed.  Marland Kitchen Avocado Oil OIL by Does not apply route.  Rolena Infante Sagr[place] 450 MG CAPS Take by mouth. For digestion  . Cholecalciferol (VITAMIN D3 PO) Take 1 tablet by mouth daily.  . fluticasone (FLONASE) 50 MCG/ACT nasal spray USE 2 SPRAYS INTO EACH NOSTRIL ONCE DAILY  . hydrochlorothiazide (HYDRODIURIL) 25 MG tablet TAKE 1 TABLET BY MOUTH ONCE DAILY  . Multiple Vitamins-Minerals (CENTRUM PO) Take by mouth.  Marland Kitchen PARoxetine (PAXIL) 20 MG tablet TAKE 1 TABLET BY MOUTH ONCE DAILY  . Vitamin D, Ergocalciferol, (DRISDOL) 1.25 MG (50000 UNIT) CAPS capsule Take 1 capsule (50,000 Units total) by mouth every 7 (seven) days.    PHQ 2/9 Scores 06/09/2020 05/17/2019 12/03/2018 09/14/2017  PHQ - 2 Score 2 0 0 0  PHQ- 9 Score 5 4 2  -    GAD 7 : Generalized Anxiety Score 06/09/2020 05/17/2019  Nervous, Anxious, on Edge 0 3  Control/stop worrying 0 0  Worry too much - different things 0 0  Trouble relaxing 0 1  Restless 0 3  Easily annoyed or irritable 0 3  Afraid - awful might happen 0 0  Total GAD 7 Score 0 10  Anxiety Difficulty - Very difficult    BP Readings from Last 3 Encounters:  06/09/20 (!) 122/92  12/06/19 128/80  05/17/19 124/84    Physical Exam Vitals and nursing note reviewed.  Constitutional:      General: She is not in acute distress.    Appearance: She is well-developed.  HENT:     Head: Normocephalic and atraumatic.  Cardiovascular:     Rate and Rhythm: Normal rate and regular rhythm.  Pulmonary:     Effort: Pulmonary effort is normal. No respiratory distress.     Breath sounds: No wheezing or rhonchi.  Musculoskeletal:     Cervical back: Normal range of motion.     Right lower leg: No edema.     Left lower leg: No edema.  Skin:    General: Skin is warm and dry.     Findings: No rash.  Neurological:     Mental Status: She is alert and oriented to person, place,  and time.  Psychiatric:        Mood and Affect: Mood normal.        Behavior: Behavior normal.     Wt Readings from Last 3 Encounters:  06/09/20 210 lb (95.3 kg)  12/06/19 206 lb (93.4 kg)  05/17/19 202 lb (91.6 kg)    BP (!) 122/92   Pulse 74   Temp 98.3 F (36.8 C) (Oral)   Ht 5\' 3"  (1.6 m)   Wt 210 lb (95.3 kg)   SpO2 99%   BMI 37.20 kg/m   Assessment and Plan: 1. Essential hypertension BP not controlled - will add cozaar Follow up in 2 mo for recheck Work on diet and low sodium food choices -  losartan (COZAAR) 25 MG tablet; Take 1 tablet (25 mg total) by mouth daily.  Dispense: 90 tablet; Refill: 1  2. Generalized anxiety disorder Doing well on current therapy - PARoxetine (PAXIL) 20 MG tablet; Take 1 tablet (20 mg total) by mouth daily.  Dispense: 90 tablet; Refill: 1  3. Shortness of breath Since covid; will get CXR and then refer to [facility] Pulmonary per patient request - EKG 12-Lead - SR with freq PACs, rate 72 - DG Chest 2 View; Future   Partially dictated using Editor, commissioning. Any errors are unintentional.  Halina Maidens, MD [facility]  06/09/2020

## 2020-06-25 DIAGNOSIS — H6123 Impacted cerumen, bilateral: Secondary | ICD-10-CM | POA: Diagnosis not present

## 2020-06-25 DIAGNOSIS — H6063 Unspecified chronic otitis externa, bilateral: Secondary | ICD-10-CM | POA: Diagnosis not present

## 2020-07-28 ENCOUNTER — Other Ambulatory Visit: Payer: Self-pay | Admitting: Internal Medicine

## 2020-08-07 ENCOUNTER — Ambulatory Visit: Payer: Medicare PPO | Admitting: Internal Medicine

## 2020-08-19 ENCOUNTER — Ambulatory Visit: Payer: Medicare PPO | Admitting: Internal Medicine

## 2020-08-19 ENCOUNTER — Encounter: Payer: Self-pay | Admitting: Internal Medicine

## 2020-08-19 ENCOUNTER — Other Ambulatory Visit: Payer: Self-pay

## 2020-08-19 VITALS — BP 112/86 | HR 80 | Temp 98.2°F | Ht 63.0 in | Wt 207.0 lb

## 2020-08-19 DIAGNOSIS — R0600 Dyspnea, unspecified: Secondary | ICD-10-CM

## 2020-08-19 DIAGNOSIS — R0609 Other forms of dyspnea: Secondary | ICD-10-CM

## 2020-08-19 DIAGNOSIS — I10 Essential (primary) hypertension: Secondary | ICD-10-CM | POA: Diagnosis not present

## 2020-08-19 NOTE — Patient Instructions (Signed)
Try B12 500 mg daily

## 2020-08-19 NOTE — Progress Notes (Signed)
Date:  08/19/2020   Name:  Joanne Benton   DOB:  [birth year]   MRN:  [id number]   Chief Complaint: Hypertension  Hypertension This is a chronic problem. Pertinent negatives include no chest pain, headaches, orthopnea, palpitations or shortness of breath (with exertion). Past treatments include angiotensin blockers and diuretics (losartan added last visit). There are no compliance problems.   Shortness of Breath This is a chronic problem. The current episode started more than 1 year ago. The problem occurs daily. The problem has been unchanged. Pertinent negatives include no abdominal pain, chest pain, headaches, leg swelling, orthopnea or wheezing. The symptoms are aggravated by exercise (especially walking any distance/carrying groceries). Risk factors: seems to be worse since Covid 2020. She has tried nothing for the symptoms.    Lab Results  Component Value Date   CREATININE 0.98 12/06/2019   BUN 17 12/06/2019   NA 139 12/06/2019   K 3.7 12/06/2019   CL 100 12/06/2019   CO2 25 12/06/2019   Lab Results  Component Value Date   CHOL 245 (H) 12/06/2019   HDL 41 12/06/2019   LDLCALC 160 (H) 12/06/2019   LDLDIRECT 167.0 02/10/2014   TRIG 237 (H) 12/06/2019   CHOLHDL 6.0 (H) 12/06/2019   Lab Results  Component Value Date   TSH 1.450 12/06/2019   Lab Results  Component Value Date   HGBA1C 5.0 11/30/2015   Lab Results  Component Value Date   WBC 5.8 12/06/2019   HGB 13.9 12/06/2019   HCT 41.9 12/06/2019   MCV 98 (H) 12/06/2019   PLT 245 12/06/2019   Lab Results  Component Value Date   ALT 35 (H) 12/06/2019   AST 33 12/06/2019   ALKPHOS 55 12/06/2019   BILITOT 0.6 12/06/2019     Review of Systems  Constitutional: Negative for fatigue and unexpected weight change.  HENT: Negative for nosebleeds.   Eyes: Negative for visual disturbance.  Respiratory: Negative for cough, chest tightness, shortness of breath (with exertion) and wheezing.   Cardiovascular: Negative  for chest pain, palpitations, orthopnea and leg swelling.  Gastrointestinal: Negative for abdominal pain, constipation and diarrhea.  Neurological: Negative for dizziness, weakness, light-headedness and headaches.    Patient Active Problem List   Diagnosis Date Noted  . Generalized anxiety disorder 01/04/2016  . Status post total right knee replacement 01/24/2015  . Nocturnal muscle cramps 12/03/2012  . Alopecia areata 06/25/2012  . Bradycardia 03/26/2012  . Hx of adenomatous colonic polyps   . Overweight (BMI 25.0-29.9) 04/14/2011  . Lumbago without sciatica 04/14/2011  . Essential hypertension 04/14/2011  . Hyperlipidemia with target LDL less than 100 04/14/2011    Allergies  Allergen Reactions  . Oxycodone Nausea Only  . Statins Other (See Comments)    Muscle spasms    Past Surgical History:  Procedure Laterality Date  . ABDOMINAL HYSTERECTOMY  1978   secondary to IUD complicaiton   . COLONOSCOPY    . COLONOSCOPY WITH PROPOFOL N/A 09/16/2016   Procedure: COLONOSCOPY WITH PROPOFOL;  Surgeon: Lucilla Lame, MD;  Location: [place];  Service: Endoscopy;  Laterality: N/A;  . epidermal inclusion cyst excision of index finger Right 11/2015  . FINGER MASS EXCISION Right 11/2015   2nd digit  . POLYPECTOMY  09/16/2016   Procedure: POLYPECTOMY;  Surgeon: Lucilla Lame, MD;  Location: [place];  Service: Endoscopy;;  . REPLACEMENT TOTAL KNEE  August 2012   Right , [place]    Social History   Tobacco Use  .  Smoking status: Never Smoker  . Smokeless tobacco: Never Used  Vaping Use  . Vaping Use: Never used  Substance Use Topics  . Alcohol use: No  . Drug use: No     Medication list has been reviewed and updated.  Current Meds  Medication Sig  . Acetaminophen (TYLENOL ARTHRITIS PAIN PO) Take 600 mg by mouth as needed.  Marland Kitchen Avocado Oil OIL by Does not apply route.  Rolena Infante Sagrada 450 MG CAPS Take by mouth. For digestion  . Cholecalciferol (VITAMIN D3  PO) Take 1 tablet by mouth daily.  . fluticasone (FLONASE) 50 MCG/ACT nasal spray USE 2 SPRAYS INTO EACH NOSTRIL ONCE DAILY  . hydrochlorothiazide (HYDRODIURIL) 25 MG tablet TAKE 1 TABLET BY MOUTH ONCE DAILY  . losartan (COZAAR) 25 MG tablet Take 1 tablet (25 mg total) by mouth daily.  . Multiple Vitamins-Minerals (CENTRUM PO) Take by mouth.  Marland Kitchen PARoxetine (PAXIL) 20 MG tablet Take 1 tablet (20 mg total) by mouth daily.  . Vitamin D, Ergocalciferol, (DRISDOL) 1.25 MG (50000 UNIT) CAPS capsule Take 1 capsule (50,000 Units total) by mouth every 7 (seven) days.    PHQ 2/9 Scores 08/19/2020 06/09/2020 05/17/2019 12/03/2018  PHQ - 2 Score 0 2 0 0  PHQ- 9 Score 1 5 4 2     GAD 7 : Generalized Anxiety Score 08/19/2020 06/09/2020 05/17/2019  Nervous, Anxious, on Edge 1 0 3  Control/stop worrying 0 0 0  Worry too much - different things 0 0 0  Trouble relaxing 0 0 1  Restless 0 0 3  Easily annoyed or irritable 0 0 3  Afraid - awful might happen 0 0 0  Total GAD 7 Score 1 0 10  Anxiety Difficulty Not difficult at all - Very difficult    BP Readings from Last 3 Encounters:  08/19/20 112/86  06/09/20 (!) 122/92  12/06/19 128/80    Physical Exam Vitals and nursing note reviewed.  Constitutional:      General: She is not in acute distress.    Appearance: She is well-developed.  HENT:     Head: Normocephalic and atraumatic.  Cardiovascular:     Rate and Rhythm: Normal rate and regular rhythm.     Pulses: Normal pulses.     Heart sounds: No murmur heard.   Pulmonary:     Effort: Pulmonary effort is normal. No respiratory distress.     Breath sounds: No wheezing or rhonchi.  Musculoskeletal:     Cervical back: Normal range of motion.     Right lower leg: No edema.     Left lower leg: No edema.  Lymphadenopathy:     Cervical: No cervical adenopathy.  Skin:    General: Skin is warm and dry.     Findings: No rash.  Neurological:     General: No focal deficit present.     Mental Status:  She is alert and oriented to person, place, and time.  Psychiatric:        Mood and Affect: Mood normal.     Wt Readings from Last 3 Encounters:  08/19/20 207 lb (93.9 kg)  06/09/20 210 lb (95.3 kg)  12/06/19 206 lb (93.4 kg)    BP 112/86   Pulse 80   Temp 98.2 F (36.8 C) (Oral)   Ht 5\' 3"  (1.6 m)   Wt 207 lb (93.9 kg)   SpO2 98%   BMI 36.67 kg/m   Assessment and Plan: 1. Essential hypertension Clinically stable exam with well controlled  BP with the addition of losartan. Tolerating medications without side effects at this time. Pt to continue current regimen and low sodium diet; benefits of regular exercise as able discussed.  2. DOE (dyspnea on exertion) Recent CXR normal BP controlled. Strong family hx of CAD. - Ambulatory referral to Cardiology    Partially dictated using [place]. Any errors are unintentional.  Halina Maidens, MD [facility]  08/19/2020

## 2020-09-04 ENCOUNTER — Other Ambulatory Visit: Payer: Self-pay | Admitting: Internal Medicine

## 2020-09-04 DIAGNOSIS — I10 Essential (primary) hypertension: Secondary | ICD-10-CM

## 2020-09-15 ENCOUNTER — Other Ambulatory Visit: Payer: Self-pay | Admitting: Internal Medicine

## 2020-09-15 DIAGNOSIS — F411 Generalized anxiety disorder: Secondary | ICD-10-CM

## 2020-09-22 DIAGNOSIS — R0602 Shortness of breath: Secondary | ICD-10-CM | POA: Diagnosis not present

## 2020-09-22 DIAGNOSIS — E785 Hyperlipidemia, unspecified: Secondary | ICD-10-CM | POA: Diagnosis not present

## 2020-09-22 DIAGNOSIS — F411 Generalized anxiety disorder: Secondary | ICD-10-CM | POA: Diagnosis not present

## 2020-09-22 DIAGNOSIS — R001 Bradycardia, unspecified: Secondary | ICD-10-CM | POA: Diagnosis not present

## 2020-09-22 DIAGNOSIS — I10 Essential (primary) hypertension: Secondary | ICD-10-CM | POA: Diagnosis not present

## 2020-10-26 ENCOUNTER — Other Ambulatory Visit: Payer: Self-pay | Admitting: Internal Medicine

## 2020-10-26 DIAGNOSIS — R0602 Shortness of breath: Secondary | ICD-10-CM | POA: Diagnosis not present

## 2020-11-04 DIAGNOSIS — R001 Bradycardia, unspecified: Secondary | ICD-10-CM | POA: Diagnosis not present

## 2020-11-04 DIAGNOSIS — I10 Essential (primary) hypertension: Secondary | ICD-10-CM | POA: Diagnosis not present

## 2020-11-04 DIAGNOSIS — R0602 Shortness of breath: Secondary | ICD-10-CM | POA: Diagnosis not present

## 2020-11-04 DIAGNOSIS — E785 Hyperlipidemia, unspecified: Secondary | ICD-10-CM | POA: Diagnosis not present

## 2020-11-04 DIAGNOSIS — F411 Generalized anxiety disorder: Secondary | ICD-10-CM | POA: Diagnosis not present

## 2020-11-12 DIAGNOSIS — J029 Acute pharyngitis, unspecified: Secondary | ICD-10-CM | POA: Diagnosis not present

## 2020-11-12 DIAGNOSIS — Z03818 Encounter for observation for suspected exposure to other biological agents ruled out: Secondary | ICD-10-CM | POA: Diagnosis not present

## 2020-11-12 DIAGNOSIS — U071 COVID-19: Secondary | ICD-10-CM | POA: Diagnosis not present

## 2020-12-11 ENCOUNTER — Encounter: Payer: Self-pay | Admitting: Internal Medicine

## 2020-12-11 ENCOUNTER — Ambulatory Visit (INDEPENDENT_AMBULATORY_CARE_PROVIDER_SITE_OTHER): Payer: Medicare PPO | Admitting: Internal Medicine

## 2020-12-11 ENCOUNTER — Other Ambulatory Visit: Payer: Self-pay

## 2020-12-11 VITALS — BP 126/82 | HR 70 | Temp 98.3°F | Ht 63.0 in | Wt 210.0 lb

## 2020-12-11 DIAGNOSIS — I10 Essential (primary) hypertension: Secondary | ICD-10-CM | POA: Diagnosis not present

## 2020-12-11 DIAGNOSIS — Z1231 Encounter for screening mammogram for malignant neoplasm of breast: Secondary | ICD-10-CM

## 2020-12-11 DIAGNOSIS — E785 Hyperlipidemia, unspecified: Secondary | ICD-10-CM | POA: Diagnosis not present

## 2020-12-11 DIAGNOSIS — F411 Generalized anxiety disorder: Secondary | ICD-10-CM | POA: Diagnosis not present

## 2020-12-11 DIAGNOSIS — Z0000 Encounter for general adult medical examination without abnormal findings: Secondary | ICD-10-CM

## 2020-12-11 LAB — POCT URINALYSIS DIPSTICK
Bilirubin, UA: NEGATIVE
Blood, UA: NEGATIVE
Glucose, UA: NEGATIVE
Ketones, UA: NEGATIVE
Leukocytes, UA: NEGATIVE
Nitrite, UA: NEGATIVE
Protein, UA: NEGATIVE
Spec Grav, UA: <=1.005 — AB (ref 1.010–1.025)
Urobilinogen, UA: 0.2 E.U./dL
pH, UA: 6.5 (ref 5.0–8.0)

## 2020-12-11 NOTE — Patient Instructions (Signed)
Take Flonase or Claritin for the drainage/cough.  Schedule Mammogram for January

## 2020-12-11 NOTE — Progress Notes (Signed)
Date:  12/11/2020   Name:  Joanne Benton   DOB:  [birth year]   MRN:  [id number]   Chief Complaint: Annual Exam (Breast exam no pap) Joanne Benton is a 75 y.o. female who presents today for her Complete Annual Exam. She feels well. She reports exercising walking x3 days a week. She reports she is sleeping well. Breast complaints none.  Mammogram: 04/2020 [place] DEXA: 04/2020 osteopenia hip/normal spine Colonoscopy: 09/2016  Immunization History  Administered Date(s) Administered   Influenza Split 03/07/2011, 12/22/2011, 01/10/2014   Influenza, High Dose Seasonal PF 12/26/2016   Influenza-Unspecified 12/07/2015, 12/27/2017, 12/15/2018   PFIZER Comirnaty(Gray Top)Covid-19 Tri-Sucrose Vaccine 05/30/2019, 06/20/2019, 01/17/2020   Pneumococcal Conjugate-13 02/13/2015   Pneumococcal Polysaccharide-23 02/10/2014, 03/17/2017   Tdap 12/03/2012   Zoster Recombinat (Shingrix) 03/17/2017, 06/02/2017   Zoster, Live 02/11/2010    Hypertension This is a chronic problem. The problem is controlled. Associated symptoms include anxiety and shortness of breath (on exertion). Pertinent negatives include no chest pain, headaches or palpitations. Past treatments include angiotensin blockers and diuretics. There is no history of kidney disease, CAD/MI or CVA.  Anxiety Presents for follow-up visit. Symptoms include shortness of breath (on exertion). Patient reports no chest pain, dizziness, nervous/anxious behavior or palpitations. Symptoms occur rarely. The quality of sleep is good.   Compliance with medications is 76-100%.   Lab Results  Component Value Date   CREATININE 0.98 12/06/2019   BUN 17 12/06/2019   NA 139 12/06/2019   K 3.7 12/06/2019   CL 100 12/06/2019   CO2 25 12/06/2019   Lab Results  Component Value Date   CHOL 245 (H) 12/06/2019   HDL 41 12/06/2019   LDLCALC 160 (H) 12/06/2019   LDLDIRECT 167.0 02/10/2014   TRIG 237 (H) 12/06/2019   CHOLHDL 6.0 (H) 12/06/2019   Lab Results   Component Value Date   TSH 1.450 12/06/2019   Lab Results  Component Value Date   HGBA1C 5.0 11/30/2015   Lab Results  Component Value Date   WBC 5.8 12/06/2019   HGB 13.9 12/06/2019   HCT 41.9 12/06/2019   MCV 98 (H) 12/06/2019   PLT 245 12/06/2019   Lab Results  Component Value Date   ALT 35 (H) 12/06/2019   AST 33 12/06/2019   ALKPHOS 55 12/06/2019   BILITOT 0.6 12/06/2019     Review of Systems  Constitutional:  Negative for chills, fatigue and fever.  HENT:  Negative for congestion, hearing loss, tinnitus, trouble swallowing and voice change.   Eyes:  Negative for visual disturbance.  Respiratory:  Positive for shortness of breath (on exertion). Negative for cough, chest tightness and wheezing.   Cardiovascular:  Negative for chest pain, palpitations and leg swelling.  Gastrointestinal:  Negative for abdominal pain, constipation, diarrhea and vomiting.  Endocrine: Negative for polydipsia and polyuria.  Genitourinary:  Negative for dysuria, frequency, genital sores, vaginal bleeding and vaginal discharge.  Musculoskeletal:  Negative for arthralgias, gait problem and joint swelling.  Skin:  Negative for color change and rash.  Neurological:  Negative for dizziness, tremors, light-headedness and headaches.  Hematological:  Negative for adenopathy. Does not bruise/bleed easily.  Psychiatric/Behavioral:  Negative for dysphoric mood and sleep disturbance. The patient is not nervous/anxious.    Patient Active Problem List   Diagnosis Date Noted   DOE (dyspnea on exertion) 08/19/2020   Generalized anxiety disorder 01/04/2016   Status post total right knee replacement 01/24/2015   Nocturnal muscle cramps 12/03/2012   Alopecia areata  06/25/2012   Bradycardia 03/26/2012   Hx of adenomatous colonic polyps    Overweight (BMI 25.0-29.9) 04/14/2011   Lumbago without sciatica 04/14/2011   Essential hypertension 04/14/2011   Hyperlipidemia with target LDL less than 100  04/14/2011    Allergies  Allergen Reactions   Oxycodone Nausea Only   Statins Other (See Comments)    Muscle spasms    Past Surgical History:  Procedure Laterality Date   ABDOMINAL HYSTERECTOMY  1978   secondary to IUD complicaiton    COLONOSCOPY     COLONOSCOPY WITH PROPOFOL N/A 09/16/2016   Procedure: COLONOSCOPY WITH PROPOFOL;  Surgeon: Lucilla Lame, MD;  Location: [place];  Service: Endoscopy;  Laterality: N/A;   epidermal inclusion cyst excision of index finger Right 11/2015   FINGER MASS EXCISION Right 11/2015   2nd digit   POLYPECTOMY  09/16/2016   Procedure: POLYPECTOMY;  Surgeon: Lucilla Lame, MD;  Location: [place];  Service: Endoscopy;;   REPLACEMENT TOTAL KNEE  August 2012   Right , [place]    Social History   Tobacco Use   Smoking status: Never   Smokeless tobacco: Never  Vaping Use   Vaping Use: Never used  Substance Use Topics   Alcohol use: No   Drug use: No     Medication list has been reviewed and updated.  Current Meds  Medication Sig   Acetaminophen (TYLENOL ARTHRITIS PAIN PO) Take 600 mg by mouth as needed.   Avocado Oil OIL by Does not apply route.   Cascara Sagrada 450 MG CAPS Take by mouth. For digestion   Cholecalciferol (VITAMIN D3 PO) Take 1 tablet by mouth daily.   Cyanocobalamin (VITAMIN B-12 PO) Take by mouth daily.   fluticasone (FLONASE) 50 MCG/ACT nasal spray USE 2 SPRAYS INTO EACH NOSTRIL ONCE DAILY   hydrochlorothiazide (HYDRODIURIL) 25 MG tablet TAKE 1 TABLET BY MOUTH ONCE DAILY   losartan (COZAAR) 25 MG tablet Take 1 tablet by mouth daily.   Multiple Vitamins-Minerals (CENTRUM PO) Take by mouth.   PARoxetine (PAXIL) 20 MG tablet TAKE 1 TABLET BY MOUTH ONCE DAILY   Vitamin D, Ergocalciferol, (DRISDOL) 1.25 MG (50000 UNIT) CAPS capsule Take 1 capsule (50,000 Units total) by mouth every 7 (seven) days.   [DISCONTINUED] amoxicillin (AMOXIL) 500 MG capsule     PHQ 2/9 Scores 12/11/2020 08/19/2020 06/09/2020  05/17/2019  PHQ - 2 Score 0 0 2 0  PHQ- 9 Score 0 '1 5 4    '$ GAD 7 : Generalized Anxiety Score 12/11/2020 08/19/2020 06/09/2020 05/17/2019  Nervous, Anxious, on Edge 0 1 0 3  Control/stop worrying 0 0 0 0  Worry too much - different things 0 0 0 0  Trouble relaxing 0 0 0 1  Restless 0 0 0 3  Easily annoyed or irritable 0 0 0 3  Afraid - awful might happen 0 0 0 0  Total GAD 7 Score 0 1 0 10  Anxiety Difficulty - Not difficult at all - Very difficult    BP Readings from Last 3 Encounters:  12/11/20 126/82  08/19/20 112/86  06/09/20 (!) 122/92    Physical Exam Vitals and nursing note reviewed.  Constitutional:      General: She is not in acute distress.    Appearance: She is well-developed.  HENT:     Head: Normocephalic and atraumatic.     Right Ear: Tympanic membrane and ear canal normal.     Left Ear: Tympanic membrane and ear canal normal.  Nose:     Right Sinus: No maxillary sinus tenderness.     Left Sinus: No maxillary sinus tenderness.  Eyes:     General: No scleral icterus.       Right eye: No discharge.        Left eye: No discharge.     Conjunctiva/sclera: Conjunctivae normal.  Neck:     Thyroid: No thyromegaly.     Vascular: No carotid bruit.  Cardiovascular:     Rate and Rhythm: Normal rate and regular rhythm.     Pulses: Normal pulses.     Heart sounds: Normal heart sounds.  Pulmonary:     Effort: Pulmonary effort is normal. No respiratory distress.     Breath sounds: No wheezing.  Chest:  Breasts:    Right: No mass, nipple discharge, skin change or tenderness.     Left: No mass, nipple discharge, skin change or tenderness.  Abdominal:     General: Bowel sounds are normal.     Palpations: Abdomen is soft.     Tenderness: There is no abdominal tenderness.  Musculoskeletal:     Cervical back: Normal range of motion. No erythema.     Right lower leg: No edema.     Left lower leg: No edema.  Lymphadenopathy:     Cervical: No cervical adenopathy.   Skin:    General: Skin is warm and dry.     Findings: No rash.  Neurological:     Mental Status: She is alert and oriented to person, place, and time.     Cranial Nerves: No cranial nerve deficit.     Sensory: No sensory deficit.     Deep Tendon Reflexes: Reflexes are normal and symmetric.  Psychiatric:        Attention and Perception: Attention normal.        Mood and Affect: Mood normal.    Wt Readings from Last 3 Encounters:  12/11/20 210 lb (95.3 kg)  08/19/20 207 lb (93.9 kg)  06/09/20 210 lb (95.3 kg)    BP 126/82   Pulse 70   Temp 98.3 F (36.8 C) (Oral)   Ht '5\' 3"'$  (1.6 m)   Wt 210 lb (95.3 kg)   SpO2 97%   BMI 37.20 kg/m   Assessment and Plan: 1. Annual physical exam Exam is normal except for weight. Encourage regular exercise and appropriate dietary changes.  2. Encounter for screening mammogram for breast cancer Schedule at [facility] in January - MM 3D SCREEN BREAST BILATERAL  3. Essential hypertension Clinically stable exam with well controlled BP. Tolerating medications without side effects at this time. Pt to continue current regimen and low sodium diet; benefits of regular exercise as able discussed. - CBC with Differential/Platelet - Comprehensive metabolic panel - TSH - POCT urinalysis dipstick  4. Hyperlipidemia with target LDL less than 100 Continue low fat diet, exercise - Lipid panel  5. Generalized anxiety disorder Doing well on Paxil daily. No side effects, no SI/HI Continue current therapy.   Partially dictated using [place], [place]. Any errors are unintentional.  Halina Maidens, MD [facility]  12/11/2020

## 2020-12-12 LAB — COMPREHENSIVE METABOLIC PANEL
ALT: 30 IU/L (ref 0–32)
AST: 28 IU/L (ref 0–40)
Albumin/Globulin Ratio: 1.5 (ref 1.2–2.2)
Albumin: 4 g/dL (ref 3.7–4.7)
Alkaline Phosphatase: 58 IU/L (ref 44–121)
BUN/Creatinine Ratio: 17 (ref 12–28)
BUN: 17 mg/dL (ref 8–27)
Bilirubin Total: 0.4 mg/dL (ref 0.0–1.2)
CO2: 23 mmol/L (ref 20–29)
Calcium: 9.5 mg/dL (ref 8.7–10.3)
Chloride: 101 mmol/L (ref 96–106)
Creatinine, Ser: 0.98 mg/dL (ref 0.57–1.00)
Globulin, Total: 2.6 g/dL (ref 1.5–4.5)
Glucose: 104 mg/dL — ABNORMAL HIGH (ref 65–99)
Potassium: 3.8 mmol/L (ref 3.5–5.2)
Sodium: 140 mmol/L (ref 134–144)
Total Protein: 6.6 g/dL (ref 6.0–8.5)
eGFR: 61 mL/min/{1.73_m2} (ref >59)

## 2020-12-12 LAB — CBC WITH DIFFERENTIAL/PLATELET
Basophils Absolute: 0.1 10*3/uL (ref 0.0–0.2)
Basos: 1 %
EOS (ABSOLUTE): 0.1 10*3/uL (ref 0.0–0.4)
Eos: 2 %
Hematocrit: 40.2 % (ref 34.0–46.6)
Hemoglobin: 13.8 g/dL (ref 11.1–15.9)
Immature Grans (Abs): 0.1 10*3/uL (ref 0.0–0.1)
Immature Granulocytes: 1 %
Lymphocytes Absolute: 2.3 10*3/uL (ref 0.7–3.1)
Lymphs: 34 %
MCH: 33.1 pg — ABNORMAL HIGH (ref 26.6–33.0)
MCHC: 34.3 g/dL (ref 31.5–35.7)
MCV: 96 fL (ref 79–97)
Monocytes Absolute: 0.6 10*3/uL (ref 0.1–0.9)
Monocytes: 10 %
Neutrophils Absolute: 3.5 10*3/uL (ref 1.4–7.0)
Neutrophils: 52 %
Platelets: 246 10*3/uL (ref 150–450)
RBC: 4.17 x10E6/uL (ref 3.77–5.28)
RDW: 12.8 % (ref 11.7–15.4)
WBC: 6.6 10*3/uL (ref 3.4–10.8)

## 2020-12-12 LAB — LIPID PANEL
Chol/HDL Ratio: 6.1 ratio — ABNORMAL HIGH (ref 0.0–4.4)
Cholesterol, Total: 227 mg/dL — ABNORMAL HIGH (ref 100–199)
HDL: 37 mg/dL — ABNORMAL LOW (ref >39)
LDL Chol Calc (NIH): 126 mg/dL — ABNORMAL HIGH (ref 0–99)
Triglycerides: 362 mg/dL — ABNORMAL HIGH (ref 0–149)
VLDL Cholesterol Cal: 64 mg/dL — ABNORMAL HIGH (ref 5–40)

## 2020-12-12 LAB — TSH: TSH: 1.6 u[IU]/mL (ref 0.450–4.500)

## 2021-01-12 DIAGNOSIS — Z96651 Presence of right artificial knee joint: Secondary | ICD-10-CM | POA: Diagnosis not present

## 2021-01-21 ENCOUNTER — Other Ambulatory Visit: Payer: Self-pay | Admitting: Internal Medicine

## 2021-01-21 NOTE — Telephone Encounter (Signed)
Requested Prescriptions  Pending Prescriptions Disp Refills  . hydrochlorothiazide (HYDRODIURIL) 25 MG tablet [Pharmacy Med Name: HYDROCHLOROTHIAZIDE 25 MG TAB] 90 tablet 0    Sig: TAKE 1 TABLET BY MOUTH ONCE DAILY     Cardiovascular: Diuretics - Thiazide Passed - 01/21/2021 10:00 AM      Passed - Ca in normal range and within 360 days    Calcium  Date Value Ref Range Status  12/11/2020 9.5 8.7 - 10.3 mg/dL Final         Passed - Cr in normal range and within 360 days    Creatinine, Ser  Date Value Ref Range Status  12/11/2020 0.98 0.57 - 1.00 mg/dL Final         Passed - K in normal range and within 360 days    Potassium  Date Value Ref Range Status  12/11/2020 3.8 3.5 - 5.2 mmol/L Final  02/04/2013 3.4 (L) 3.5 - 5.1 mmol/L Final         Passed - Na in normal range and within 360 days    Sodium  Date Value Ref Range Status  12/11/2020 140 134 - 144 mmol/L Final         Passed - Last BP in normal range    BP Readings from Last 1 Encounters:  12/11/20 126/82         Passed - Valid encounter within last 6 months    Recent Outpatient Visits          1 month ago Annual physical exam   [facility] Glean Hess, MD   5 months ago Essential hypertension   [place], Laura H, MD   7 months ago Essential hypertension   [facility] Glean Hess, MD   1 year ago Annual physical exam   [facility] Glean Hess, MD   1 year ago Essential hypertension   [facility] Glean Hess, MD      Future Appointments            In 4 months Army Melia Jesse Sans, MD [facility], [place]   In 10 months Army Melia Jesse Sans, MD [facility], [facility]

## 2021-01-23 ENCOUNTER — Other Ambulatory Visit: Payer: Self-pay | Admitting: Internal Medicine

## 2021-01-23 DIAGNOSIS — E559 Vitamin D deficiency, unspecified: Secondary | ICD-10-CM

## 2021-01-23 NOTE — Telephone Encounter (Signed)
Requested medication (s) are due for refill today: yes  Requested medication (s) are on the active medication list: yes  Last refill:  11/2319 #5 12 RF  Future visit scheduled: yes  Notes to clinic:  overdue blood work   Requested Prescriptions  Pending Prescriptions Disp Refills   Vitamin D, Ergocalciferol, (DRISDOL) 1.25 MG (50000 UNIT) CAPS capsule [Pharmacy Med Name: VITAMIN D (ERGOCALCIFEROL) 1.25 MG] 5 capsule 12    Sig: TAKE 1 CAPSULE BY MOUTH SAME [place]     Endocrinology:  Vitamins - Vitamin D Supplementation Failed - 01/23/2021  9:42 AM      Failed - 50,000 IU strengths are not delegated      Failed - Phosphate in normal range and within 360 days    No results found for: PHOS        Failed - Vitamin D in normal range and within 360 days    VITD  Date Value Ref Range Status  06/01/2015 28.54 (L) 30.00 - 100.00 ng/mL Final   Vit D, 25-Hydroxy  Date Value Ref Range Status  12/06/2019 28.4 (L) 30.0 - 100.0 ng/mL Final    Comment:    Vitamin D deficiency has been defined by the Institute of Medicine and an Endocrine Society practice guideline as a level of serum 25-OH vitamin D less than 20 ng/mL (1,2). The Endocrine Society went on to further define vitamin D insufficiency as a level between 21 and 29 ng/mL (2). 1. IOM (Institute of Medicine). 2010. Dietary reference    intakes for calcium and D. [place]: The    [employer]. 2. Holick MF, Binkley [place], Bischoff-Ferrari HA, et al.    Evaluation, treatment, and prevention of vitamin D    deficiency: an Endocrine Society clinical practice    guideline. JCEM. 2011 Jul; 96(7):1911-30.           Passed - Ca in normal range and within 360 days    Calcium  Date Value Ref Range Status  12/11/2020 9.5 8.7 - 10.3 mg/dL Final          Passed - Valid encounter within last 12 months    Recent Outpatient Visits           1 month ago Annual physical exam   [facility] Glean Hess, MD   5 months ago Essential hypertension   [place], [place]   7 months ago Essential hypertension   [facility] Glean Hess, MD   1 year ago Annual physical exam   [facility] Glean Hess, MD   1 year ago Essential hypertension   [facility] Glean Hess, MD       Future Appointments             In 4 months Army Melia Jesse Sans, MD [facility], [place]   In 10 months Army Melia Jesse Sans, MD [facility], [facility]

## 2021-02-18 ENCOUNTER — Other Ambulatory Visit: Payer: Self-pay | Admitting: Internal Medicine

## 2021-02-18 DIAGNOSIS — E559 Vitamin D deficiency, unspecified: Secondary | ICD-10-CM

## 2021-02-18 NOTE — Telephone Encounter (Signed)
Requested medication (s) are due for refill today:   Provider to review  Requested medication (s) are on the active medication list:   Yes  Future visit scheduled:   Yes   Last ordered: 01/25/2021 #4, 2 refills  Non delegated refill   Requested Prescriptions  Pending Prescriptions Disp Refills   Vitamin D, Ergocalciferol, (DRISDOL) 1.25 MG (50000 UNIT) CAPS capsule [Pharmacy Med Name: VITAMIN D (ERGOCALCIFEROL) 1.25 MG] 4 capsule 2    Sig: TAKE 1 CAPSULE BY MOUTH SAME [place]     Endocrinology:  Vitamins - Vitamin D Supplementation Failed - 02/18/2021  9:06 AM      Failed - 50,000 IU strengths are not delegated      Failed - Phosphate in normal range and within 360 days    No results found for: PHOS        Failed - Vitamin D in normal range and within 360 days    VITD  Date Value Ref Range Status  06/01/2015 28.54 (L) 30.00 - 100.00 ng/mL Final   Vit D, 25-Hydroxy  Date Value Ref Range Status  12/06/2019 28.4 (L) 30.0 - 100.0 ng/mL Final    Comment:    Vitamin D deficiency has been defined by the Institute of Medicine and an Endocrine Society practice guideline as a level of serum 25-OH vitamin D less than 20 ng/mL (1,2). The Endocrine Society went on to further define vitamin D insufficiency as a level between 21 and 29 ng/mL (2). 1. IOM (Institute of Medicine). 2010. Dietary reference    intakes for calcium and D. [place]: The    [employer]. 2. Holick MF, Binkley [place], Bischoff-Ferrari HA, et al.    Evaluation, treatment, and prevention of vitamin D    deficiency: an Endocrine Society clinical practice    guideline. JCEM. 2011 Jul; 96(7):1911-30.           Passed - Ca in normal range and within 360 days    Calcium  Date Value Ref Range Status  12/11/2020 9.5 8.7 - 10.3 mg/dL Final          Passed - Valid encounter within last 12 months    Recent Outpatient Visits           2 months ago Annual physical exam   [facility]  Glean Hess, MD   6 months ago Essential hypertension   [place], MD   8 months ago Essential hypertension   [facility] Glean Hess, MD   1 year ago Annual physical exam   [facility] Glean Hess, MD   1 year ago Essential hypertension   [facility] Glean Hess, MD       Future Appointments             In 3 months Army Melia Jesse Sans, MD [facility], [place]   In 9 months Army Melia Jesse Sans, MD [facility], [facility]

## 2021-02-24 ENCOUNTER — Other Ambulatory Visit: Payer: Self-pay | Admitting: Internal Medicine

## 2021-03-02 ENCOUNTER — Other Ambulatory Visit: Payer: Self-pay | Admitting: Internal Medicine

## 2021-03-02 NOTE — Telephone Encounter (Signed)
Requested medication (s) are due for refill today: amount not specified  Requested medication (s) are on the active medication list:yes   Last refill: 12/11/20  Amount not specified  Future visit scheduled yes 06/14/21  Notes to clinic: Historical provider  Requested Prescriptions  Pending Prescriptions Disp Refills   losartan (COZAAR) 25 MG tablet [Pharmacy Med Name: LOSARTAN POTASSIUM 25 MG TAB] 90 tablet     Sig: TAKE 1 TABLET BY MOUTH ONCE DAILY     Cardiovascular:  Angiotensin Receptor Blockers Passed - 03/02/2021  8:59 AM      Passed - Cr in normal range and within 180 days    Creatinine, Ser  Date Value Ref Range Status  12/11/2020 0.98 0.57 - 1.00 mg/dL Final          Passed - K in normal range and within 180 days    Potassium  Date Value Ref Range Status  12/11/2020 3.8 3.5 - 5.2 mmol/L Final  02/04/2013 3.4 (L) 3.5 - 5.1 mmol/L Final          Passed - Patient is not pregnant      Passed - Last BP in normal range    BP Readings from Last 1 Encounters:  12/11/20 126/82          Passed - Valid encounter within last 6 months    Recent Outpatient Visits           2 months ago Annual physical exam   [facility] Glean Hess, MD   6 months ago Essential hypertension   [place], Laura H, MD   8 months ago Essential hypertension   [facility] Glean Hess, MD   1 year ago Annual physical exam   [facility] Glean Hess, MD   1 year ago Essential hypertension   [facility] Glean Hess, MD       Future Appointments             In 3 months Army Melia Jesse Sans, MD [facility], [place]   In 9 months Army Melia Jesse Sans, MD [facility], [facility]

## 2021-03-16 ENCOUNTER — Other Ambulatory Visit: Payer: Self-pay | Admitting: Internal Medicine

## 2021-03-16 DIAGNOSIS — F411 Generalized anxiety disorder: Secondary | ICD-10-CM

## 2021-03-17 NOTE — Telephone Encounter (Signed)
Requested Prescriptions  Pending Prescriptions Disp Refills  . PARoxetine (PAXIL) 20 MG tablet [Pharmacy Med Name: PAROXETINE HCL 20 MG TAB] 90 tablet 0    Sig: TAKE 1 TABLET BY MOUTH ONCE DAILY     Psychiatry:  Antidepressants - SSRI Passed - 03/16/2021  8:29 AM      Passed - Valid encounter within last 6 months    Recent Outpatient Visits          3 months ago Annual physical exam   [facility] Glean Hess, MD   7 months ago Essential hypertension   [place], MD   9 months ago Essential hypertension   [facility] Glean Hess, MD   1 year ago Annual physical exam   [facility] Glean Hess, MD   1 year ago Essential hypertension   [facility] Glean Hess, MD      Future Appointments            In 2 months Army Melia Jesse Sans, MD [facility], [place]   In 9 months Army Melia Jesse Sans, MD [facility], [facility]

## 2021-04-19 ENCOUNTER — Other Ambulatory Visit: Payer: Self-pay | Admitting: Internal Medicine

## 2021-04-19 DIAGNOSIS — E559 Vitamin D deficiency, unspecified: Secondary | ICD-10-CM

## 2021-04-21 NOTE — Telephone Encounter (Signed)
Requested medications are due for refill today.  yes  Requested medications are on the active medications list.  yes  Last refill. 02/18/2021  Future visit scheduled.   yes  Notes to clinic.  Medication not delegated.    Requested Prescriptions  Pending Prescriptions Disp Refills   Vitamin D, Ergocalciferol, (DRISDOL) 1.25 MG (50000 UNIT) CAPS capsule [Pharmacy Med Name: VITAMIN D (ERGOCALCIFEROL) 1.25 MG] 4 capsule 2    Sig: TAKE 1 CAPSULE BY MOUTH SAME DAY EVERY WEEK     Endocrinology:  Vitamins - Vitamin D Supplementation Failed - 04/19/2021 10:19 AM      Failed - 50,000 IU strengths are not delegated      Failed - Phosphate in normal range and within 360 days    No results found for: PHOS        Failed - Vitamin D in normal range and within 360 days    VITD  Date Value Ref Range Status  06/01/2015 28.54 (L) 30.00 - 100.00 ng/mL Final   Vit D, 25-Hydroxy  Date Value Ref Range Status  12/06/2019 28.4 (L) 30.0 - 100.0 ng/mL Final    Comment:    Vitamin D deficiency has been defined by the Institute of Medicine and an Endocrine Society practice guideline as a level of serum 25-OH vitamin D less than 20 ng/mL (1,2). The Endocrine Society went on to further define vitamin D insufficiency as a level between 21 and 29 ng/mL (2). 1. IOM (Institute of Medicine). 2010. Dietary reference    intakes for calcium and D. [place]: The    [employer]. 2. Holick MF, Binkley [place], Bischoff-Ferrari HA, et al.    Evaluation, treatment, and prevention of vitamin D    deficiency: an Endocrine Society clinical practice    guideline. JCEM. 2011 Jul; 96(7):1911-30.           Passed - Ca in normal range and within 360 days    Calcium  Date Value Ref Range Status  12/11/2020 9.5 8.7 - 10.3 mg/dL Final          Passed - Valid encounter within last 12 months    Recent Outpatient Visits           4 months ago Annual physical exam   [facility] Glean Hess, MD   8 months ago Essential hypertension   [facility] Glean Hess, MD   10 months ago Essential hypertension   [facility] Glean Hess, MD   1 year ago Annual physical exam   [facility] Glean Hess, MD   1 year ago Essential hypertension   [facility] Glean Hess, MD       Future Appointments             In 1 month Army Melia Jesse Sans, MD [facility], [place]   In 7 months Glean Hess, MD [facility], [facility]            Signed Prescriptions Disp Refills   hydrochlorothiazide (HYDRODIURIL) 25 MG tablet 90 tablet 0    Sig: TAKE 1 TABLET BY MOUTH ONCE DAILY     Cardiovascular: Diuretics - Thiazide Passed - 04/19/2021 10:19 AM      Passed - Ca in normal range and within 360 days    Calcium  Date Value Ref Range Status  12/11/2020 9.5 8.7 - 10.3 mg/dL Final          Passed -  Cr in normal range and within 360 days    Creatinine, Ser  Date Value Ref Range Status  12/11/2020 0.98 0.57 - 1.00 mg/dL Final          Passed - K in normal range and within 360 days    Potassium  Date Value Ref Range Status  12/11/2020 3.8 3.5 - 5.2 mmol/L Final  02/04/2013 3.4 (L) 3.5 - 5.1 mmol/L Final          Passed - Na in normal range and within 360 days    Sodium  Date Value Ref Range Status  12/11/2020 140 134 - 144 mmol/L Final          Passed - Last BP in normal range    BP Readings from Last 1 Encounters:  12/11/20 126/82          Passed - Valid encounter within last 6 months    Recent Outpatient Visits           4 months ago Annual physical exam   [facility] Glean Hess, MD   8 months ago Essential hypertension   [place], MD   10 months ago Essential hypertension   [facility] Glean Hess, MD   1 year ago Annual physical exam   [facility] Glean Hess, MD   1 year ago Essential hypertension   [facility] Glean Hess, MD       Future Appointments             In 1 month Army Melia Jesse Sans, MD [facility], [place]   In 7 months Army Melia, Jesse Sans, MD [facility], [facility]

## 2021-04-21 NOTE — Telephone Encounter (Signed)
Requested Prescriptions  Pending Prescriptions Disp Refills   hydrochlorothiazide (HYDRODIURIL) 25 MG tablet [Pharmacy Med Name: HYDROCHLOROTHIAZIDE 25 MG TAB] 90 tablet 0    Sig: TAKE 1 TABLET BY MOUTH ONCE DAILY     Cardiovascular: Diuretics - Thiazide Passed - 04/19/2021 10:19 AM      Passed - Ca in normal range and within 360 days    Calcium  Date Value Ref Range Status  12/11/2020 9.5 8.7 - 10.3 mg/dL Final         Passed - Cr in normal range and within 360 days    Creatinine, Ser  Date Value Ref Range Status  12/11/2020 0.98 0.57 - 1.00 mg/dL Final         Passed - K in normal range and within 360 days    Potassium  Date Value Ref Range Status  12/11/2020 3.8 3.5 - 5.2 mmol/L Final  02/04/2013 3.4 (L) 3.5 - 5.1 mmol/L Final         Passed - Na in normal range and within 360 days    Sodium  Date Value Ref Range Status  12/11/2020 140 134 - 144 mmol/L Final         Passed - Last BP in normal range    BP Readings from Last 1 Encounters:  12/11/20 126/82         Passed - Valid encounter within last 6 months    Recent Outpatient Visits          4 months ago Annual physical exam   [facility] Glean Hess, MD   8 months ago Essential hypertension   [facility] Glean Hess, MD   10 months ago Essential hypertension   [facility] Glean Hess, MD   1 year ago Annual physical exam   [facility] Glean Hess, MD   1 year ago Essential hypertension   [facility] Glean Hess, MD      Future Appointments            In 1 month Glean Hess, MD [facility], [place]   In 7 months Glean Hess, MD [facility], [facility]            Vitamin D, Ergocalciferol, (DRISDOL) 1.25 MG (50000 UNIT) CAPS capsule [Pharmacy Med Name: VITAMIN D (ERGOCALCIFEROL) 1.25 MG] 4 capsule 2    Sig: TAKE 1 CAPSULE BY MOUTH SAME DAY EVERY WEEK     Endocrinology:  Vitamins - Vitamin D  Supplementation Failed - 04/19/2021 10:19 AM      Failed - 50,000 IU strengths are not delegated      Failed - Phosphate in normal range and within 360 days    No results found for: PHOS       Failed - Vitamin D in normal range and within 360 days    VITD  Date Value Ref Range Status  06/01/2015 28.54 (L) 30.00 - 100.00 ng/mL Final   Vit D, 25-Hydroxy  Date Value Ref Range Status  12/06/2019 28.4 (L) 30.0 - 100.0 ng/mL Final    Comment:    Vitamin D deficiency has been defined by the Institute of Medicine and an Endocrine Society practice guideline as a level of serum 25-OH vitamin D less than 20 ng/mL (1,2). The Endocrine Society went on to further define vitamin D insufficiency as a level between 21 and 29 ng/mL (2). 1. IOM (Institute of Medicine). 2010. Dietary reference  intakes for calcium and D. [place]: The    [employer]. 2. Holick MF, Binkley [place], Bischoff-Ferrari HA, et al.    Evaluation, treatment, and prevention of vitamin D    deficiency: an Endocrine Society clinical practice    guideline. JCEM. 2011 Jul; 96(7):1911-30.          Passed - Ca in normal range and within 360 days    Calcium  Date Value Ref Range Status  12/11/2020 9.5 8.7 - 10.3 mg/dL Final         Passed - Valid encounter within last 12 months    Recent Outpatient Visits          4 months ago Annual physical exam   [facility] Glean Hess, MD   8 months ago Essential hypertension   [place], MD   10 months ago Essential hypertension   [facility] Glean Hess, MD   1 year ago Annual physical exam   [facility] Glean Hess, MD   1 year ago Essential hypertension   [facility] Glean Hess, MD      Future Appointments            In 1 month Army Melia Jesse Sans, MD [facility], [place]   In 7 months Army Melia Jesse Sans, MD [facility], [facility]

## 2021-05-12 ENCOUNTER — Other Ambulatory Visit: Payer: Self-pay

## 2021-05-12 ENCOUNTER — Ambulatory Visit
Admission: RE | Admit: 2021-05-12 | Discharge: 2021-05-12 | Disposition: A | Discharge status: Home / Self Care | Payer: Medicare PPO | Source: Ambulatory Visit | Attending: Internal Medicine | Admitting: Internal Medicine

## 2021-05-12 DIAGNOSIS — Z1231 Encounter for screening mammogram for malignant neoplasm of breast: Secondary | ICD-10-CM | POA: Diagnosis not present

## 2021-05-18 DIAGNOSIS — I10 Essential (primary) hypertension: Secondary | ICD-10-CM | POA: Diagnosis not present

## 2021-05-18 DIAGNOSIS — R0602 Shortness of breath: Secondary | ICD-10-CM | POA: Diagnosis not present

## 2021-05-18 DIAGNOSIS — E785 Hyperlipidemia, unspecified: Secondary | ICD-10-CM | POA: Diagnosis not present

## 2021-05-18 DIAGNOSIS — R001 Bradycardia, unspecified: Secondary | ICD-10-CM | POA: Diagnosis not present

## 2021-06-01 DIAGNOSIS — F411 Generalized anxiety disorder: Secondary | ICD-10-CM | POA: Diagnosis not present

## 2021-06-01 DIAGNOSIS — I10 Essential (primary) hypertension: Secondary | ICD-10-CM | POA: Diagnosis not present

## 2021-06-14 ENCOUNTER — Ambulatory Visit: Payer: Medicare PPO | Admitting: Internal Medicine

## 2021-08-09 DIAGNOSIS — L814 Other melanin hyperpigmentation: Secondary | ICD-10-CM | POA: Diagnosis not present

## 2021-08-09 DIAGNOSIS — D229 Melanocytic nevi, unspecified: Secondary | ICD-10-CM | POA: Diagnosis not present

## 2021-08-09 DIAGNOSIS — L821 Other seborrheic keratosis: Secondary | ICD-10-CM | POA: Diagnosis not present

## 2021-08-09 DIAGNOSIS — L409 Psoriasis, unspecified: Secondary | ICD-10-CM | POA: Diagnosis not present

## 2021-08-09 DIAGNOSIS — L820 Inflamed seborrheic keratosis: Secondary | ICD-10-CM | POA: Diagnosis not present

## 2021-09-27 ENCOUNTER — Telehealth: Payer: Self-pay

## 2021-09-27 ENCOUNTER — Other Ambulatory Visit: Payer: Self-pay

## 2021-09-27 DIAGNOSIS — Z86010 Personal history of colonic polyps: Secondary | ICD-10-CM

## 2021-09-27 MED ORDER — PEG 3350-KCL-NA BICARB-NACL 420 G PO SOLR: 4000.0000 mL | Freq: Once | ORAL | 0 refills | Status: AC

## 2021-09-27 NOTE — Telephone Encounter (Signed)
Gastroenterology Pre-Procedure Review  Request Date: 10/25/21 Requesting Physician: Dr. Allen Norris  PATIENT REVIEW QUESTIONS: The patient responded to the following health history questions as indicated:    1. Are you having any GI issues? no 2. Do you have a personal history of Polyps? yes (09/16/2016 3 sessile polyps) 3. Do you have a family history of Colon Cancer or Polyps? no 4. Diabetes Mellitus? no 5. Joint replacements in the past 12 months? 01/12/21 6. Major health problems in the past 3 months?no 7. Any artificial heart valves, MVP, or defibrillator?no    MEDICATIONS & ALLERGIES:    Patient reports the following regarding taking any anticoagulation/antiplatelet therapy:   Plavix, Coumadin, Eliquis, Xarelto, Lovenox, Pradaxa, Brilinta, or Effient? no Aspirin? no  Patient confirms/reports the following medications:  Current Outpatient Medications  Medication Sig Dispense Refill   Acetaminophen (TYLENOL ARTHRITIS PAIN PO) Take 600 mg by mouth as needed.     Avocado Oil OIL by Does not apply route.     [place] 450 MG CAPS Take by mouth. For digestion     Cholecalciferol (VITAMIN D3 PO) Take 1 tablet by mouth daily.     Cyanocobalamin (VITAMIN B-12 PO) Take by mouth daily.     fluticasone (FLONASE) 50 MCG/ACT nasal spray USE 2 SPRAYS INTO EACH NOSTRIL ONCE DAILY 16 g 12   hydrochlorothiazide (HYDRODIURIL) 25 MG tablet TAKE 1 TABLET BY MOUTH ONCE DAILY 90 tablet 0   losartan (COZAAR) 25 MG tablet TAKE 1 TABLET BY MOUTH ONCE DAILY 90 tablet 0   Multiple Vitamins-Minerals (CENTRUM PO) Take by mouth.     PARoxetine (PAXIL) 20 MG tablet TAKE 1 TABLET BY MOUTH ONCE DAILY 90 tablet 0   Vitamin D, Ergocalciferol, (DRISDOL) 1.25 MG (50000 UNIT) CAPS capsule TAKE 1 CAPSULE BY MOUTH SAME DAY EVERY WEEK 4 capsule 2   No current facility-administered medications for this visit.    Patient confirms/reports the following allergies:  Allergies  Allergen Reactions   Oxycodone Nausea  Only   Statins Other (See Comments)    Muscle spasms    No orders of the defined types were placed in this encounter.   AUTHORIZATION INFORMATION Primary Insurance: 1D#: Group #:  Secondary Insurance: 1D#: Group #:  SCHEDULE INFORMATION: Date: 10/25/21 Time: Location: [place]

## 2021-10-11 ENCOUNTER — Encounter: Payer: Self-pay | Admitting: Gastroenterology

## 2021-10-11 DIAGNOSIS — M9903 Segmental and somatic dysfunction of lumbar region: Secondary | ICD-10-CM | POA: Diagnosis not present

## 2021-10-11 DIAGNOSIS — M5417 Radiculopathy, lumbosacral region: Secondary | ICD-10-CM | POA: Diagnosis not present

## 2021-10-11 DIAGNOSIS — M9905 Segmental and somatic dysfunction of pelvic region: Secondary | ICD-10-CM | POA: Diagnosis not present

## 2021-10-11 DIAGNOSIS — M5416 Radiculopathy, lumbar region: Secondary | ICD-10-CM | POA: Diagnosis not present

## 2021-10-18 DIAGNOSIS — M9905 Segmental and somatic dysfunction of pelvic region: Secondary | ICD-10-CM | POA: Diagnosis not present

## 2021-10-18 DIAGNOSIS — M5417 Radiculopathy, lumbosacral region: Secondary | ICD-10-CM | POA: Diagnosis not present

## 2021-10-18 DIAGNOSIS — M5416 Radiculopathy, lumbar region: Secondary | ICD-10-CM | POA: Diagnosis not present

## 2021-10-18 DIAGNOSIS — M9903 Segmental and somatic dysfunction of lumbar region: Secondary | ICD-10-CM | POA: Diagnosis not present

## 2021-10-25 ENCOUNTER — Ambulatory Visit
Admission: RE | Admit: 2021-10-25 | Discharge: 2021-10-25 | Disposition: A | Discharge status: Home / Self Care | Payer: Medicare PPO | Attending: Gastroenterology | Admitting: Gastroenterology

## 2021-10-25 ENCOUNTER — Encounter: Payer: Self-pay | Admitting: Gastroenterology

## 2021-10-25 ENCOUNTER — Ambulatory Visit: Payer: Medicare PPO | Admitting: Anesthesiology

## 2021-10-25 ENCOUNTER — Encounter
Admission: RE | Disposition: A | Discharge status: Home / Self Care | Payer: Self-pay | Source: Home / Self Care | Attending: Gastroenterology

## 2021-10-25 ENCOUNTER — Other Ambulatory Visit: Payer: Self-pay

## 2021-10-25 DIAGNOSIS — F411 Generalized anxiety disorder: Secondary | ICD-10-CM

## 2021-10-25 DIAGNOSIS — F419 Anxiety disorder, unspecified: Secondary | ICD-10-CM | POA: Diagnosis not present

## 2021-10-25 DIAGNOSIS — R0602 Shortness of breath: Secondary | ICD-10-CM | POA: Diagnosis not present

## 2021-10-25 DIAGNOSIS — Z1211 Encounter for screening for malignant neoplasm of colon: Secondary | ICD-10-CM | POA: Insufficient documentation

## 2021-10-25 DIAGNOSIS — I10 Essential (primary) hypertension: Secondary | ICD-10-CM | POA: Diagnosis not present

## 2021-10-25 DIAGNOSIS — Z86010 Personal history of colonic polyps: Secondary | ICD-10-CM | POA: Insufficient documentation

## 2021-10-25 HISTORY — DX: Dyspnea, unspecified: R06.00

## 2021-10-25 HISTORY — DX: Other fatigue: R53.83

## 2021-10-25 HISTORY — PX: COLONOSCOPY WITH PROPOFOL: SHX5780

## 2021-10-25 SURGERY — COLONOSCOPY WITH PROPOFOL
Anesthesia: General | Site: Rectum

## 2021-10-25 MED ORDER — PROMETHAZINE HCL 25 MG/ML IJ SOLN: 6.2500 mg | INTRAMUSCULAR | Status: DC | PRN

## 2021-10-25 MED ORDER — LIDOCAINE HCL (CARDIAC) PF 100 MG/5ML IV SOSY
PREFILLED_SYRINGE | INTRAVENOUS | Status: DC | PRN
  Administered 2021-10-25: 40 mg via INTRAVENOUS

## 2021-10-25 MED ORDER — PROPOFOL 10 MG/ML IV BOLUS
INTRAVENOUS | Status: DC | PRN
  Administered 2021-10-25: 50 mg via INTRAVENOUS

## 2021-10-25 MED ORDER — MEPERIDINE HCL 25 MG/ML IJ SOLN: 6.2500 mg | INTRAMUSCULAR | Status: DC | PRN

## 2021-10-25 MED ORDER — FENTANYL CITRATE PF 50 MCG/ML IJ SOSY: 25.0000 ug | PREFILLED_SYRINGE | INTRAMUSCULAR | Status: DC | PRN

## 2021-10-25 MED ORDER — SODIUM CHLORIDE 0.9 % IV SOLN: INTRAVENOUS | Status: DC

## 2021-10-25 MED ORDER — LACTATED RINGERS IV SOLN: INTRAVENOUS | Status: DC

## 2021-10-25 SURGICAL SUPPLY — 22 items

## 2021-10-25 NOTE — Transfer of Care (Signed)
Immediate Anesthesia Transfer of Care Note  Patient: Joanne Benton  Procedure(s) Performed: COLONOSCOPY WITH PROPOFOL (Rectum)  Patient Location: PACU  Anesthesia Type: General  Level of Consciousness: awake, alert  and patient cooperative  Airway and Oxygen Therapy: Patient Spontanous Breathing and Patient connected to supplemental oxygen  Post-op Assessment: Post-op Vital signs reviewed, Patient's Cardiovascular Status Stable, Respiratory Function Stable, Patent Airway and No signs of Nausea or vomiting  Post-op Vital Signs: Reviewed and stable  Complications: No notable events documented.

## 2021-10-25 NOTE — Op Note (Signed)
[facility] Gastroenterology Patient Name: Joanne Benton Procedure Date: 10/25/2021 8:54 AM MRN: [id number] Account #: [id number] Date of Birth: [birth year] Admit Type: Outpatient Age: 76 Room: [facility] OR ROOM 01 Gender: Female Note Status: Finalized Instrument Name: [id number] Procedure:             Colonoscopy Indications:           High risk colon cancer surveillance: Personal history                         of colonic polyps Providers:             Lucilla Lame MD, MD Referring MD:          Ocie Cornfield. Ouida Sills MD, MD (Referring MD) Medicines:             Propofol per Anesthesia Complications:         No immediate complications. Procedure:             Pre-Anesthesia Assessment:                        - Prior to the procedure, a History and Physical was                         performed, and patient medications and allergies were                         reviewed. The patient's tolerance of previous                         anesthesia was also reviewed. The risks and benefits                         of the procedure and the sedation options and risks                         were discussed with the patient. All questions were                         answered, and informed consent was obtained. Prior                         Anticoagulants: The patient has taken no previous                         anticoagulant or antiplatelet agents. ASA Grade                         Assessment: II - A patient with mild systemic disease.                         After reviewing the risks and benefits, the patient                         was deemed in satisfactory condition to undergo the                         procedure.  After obtaining informed consent, the colonoscope was                         passed under direct vision. Throughout the procedure,                         the patient's blood pressure, pulse, and oxygen                         saturations were  monitored continuously. The                         Colonoscope was introduced through the anus with the                         intention of advancing to the splenic flexure. The                         scope was advanced to the descending colon before the                         procedure was aborted. Medications were given. The                         colonoscopy was performed without difficulty. The                         patient tolerated the procedure well. The quality of                         the bowel preparation was not adequate to identify                         polyps 6 mm and larger in size. Findings:      The perianal and digital rectal examinations were normal.      A large amount of stool was found in the rectum, in the sigmoid colon       and in the descending colon, precluding visualization. Impression:            - Preparation of the colon was inadequate.                        - Stool in the rectum, in the sigmoid colon and in the                         descending colon.                        - No specimens collected. Recommendation:        - Discharge patient to home.                        - Resume previous diet.                        - Continue present medications.                        - Repeat colonoscopy because the  bowel preparation was                         poor. Procedure Code(s):     --- Professional ---                        [id number], 80, Colonoscopy, flexible; diagnostic,                         including collection of specimen(s) by brushing or                         washing, when performed (separate procedure) Diagnosis Code(s):     --- Professional ---                        Z86.010, Personal history of colonic polyps CPT copyright 2019 American Medical Association. All rights reserved. The codes documented in this report are preliminary and upon coder review may  be revised to meet current compliance requirements. Lucilla Lame MD,  MD 10/25/2021 9:07:51 AM This report has been signed electronically. Number of Addenda: 0 Note Initiated On: 10/25/2021 8:54 AM Total Procedure Duration: 0 hours 2 minutes 29 seconds  Estimated Blood Loss:  Estimated blood loss: none.      [facility]

## 2021-10-25 NOTE — Anesthesia Postprocedure Evaluation (Signed)
Anesthesia Post Note  Patient: Joanne Benton  Procedure(s) Performed: COLONOSCOPY WITH PROPOFOL (Rectum)     Patient location during evaluation: PACU Anesthesia Type: General Level of consciousness: awake and alert Pain management: pain level controlled Vital Signs Assessment: post-procedure vital signs reviewed and stable Respiratory status: spontaneous breathing, nonlabored ventilation, respiratory function stable and patient connected to nasal cannula oxygen Cardiovascular status: blood pressure returned to baseline and stable Postop Assessment: no apparent nausea or vomiting Anesthetic complications: no   No notable events documented.  Ardell Aaronson, Glade Stanford

## 2021-10-25 NOTE — Anesthesia Preprocedure Evaluation (Signed)
Anesthesia Evaluation  Patient identified by MRN, date of birth, ID band Patient awake    Reviewed: Allergy & Precautions, H&P , NPO status , Patient's Chart, lab work & pertinent test results, reviewed documented beta blocker date and time   History of Anesthesia Complications (+) PONV and history of anesthetic complications  Airway Mallampati: II  TM Distance: >3 FB Neck ROM: full    Dental no notable dental hx.    Pulmonary shortness of breath,    Pulmonary exam normal breath sounds clear to auscultation       Cardiovascular Exercise Tolerance: Good hypertension, + DOE   Rhythm:regular Rate:Normal     Neuro/Psych PSYCHIATRIC DISORDERS Anxiety negative neurological ROS     GI/Hepatic negative GI ROS, Neg liver ROS,   Endo/Other  negative endocrine ROS  Renal/GU negative Renal ROS  negative genitourinary   Musculoskeletal   Abdominal   Peds  Hematology negative hematology ROS (+)   Anesthesia Other Findings   Reproductive/Obstetrics negative OB ROS                             Anesthesia Physical Anesthesia Plan  ASA: 2  Anesthesia Plan: General   Post-op Pain Management:    Induction:   PONV Risk Score and Plan:   Airway Management Planned:   Additional Equipment:   Intra-op Plan:   Post-operative Plan:   Informed Consent: I have reviewed the patients History and Physical, chart, labs and discussed the procedure including the risks, benefits and alternatives for the proposed anesthesia with the patient or authorized representative who has indicated his/her understanding and acceptance.     Dental Advisory Given  Plan Discussed with: CRNA  Anesthesia Plan Comments:         Anesthesia Quick Evaluation

## 2021-10-25 NOTE — H&P (Signed)
Lucilla Lame, MD [address] [place], [place] [address] Phone:[phone] Fax : [phone]  Primary Care Physician:  Kirk Ruths, MD Primary Gastroenterologist:  Dr. Allen Norris  Pre-Procedure History & Physical: HPI:  Joanne Benton is a 76 y.o. female is here for an colonoscopy.   Past Medical History:  Diagnosis Date   Benign neoplasm of ascending colon    Benign neoplasm of descending colon    Benign neoplasm of transverse colon    Dyspnea    Fatigue    related to COVID   Hx of adenomatous colonic polyps 2008   Sankar, repeat due 2013   Hyperlipidemia    with statin intolerance   Hypertension    Knee pain    Motion sickness    back seat of cars   Obesity    Personal history of colonic polyps    PONV (postoperative nausea and vomiting)     Past Surgical History:  Procedure Laterality Date   ABDOMINAL HYSTERECTOMY  1978   secondary to IUD complicaiton    COLONOSCOPY     COLONOSCOPY WITH PROPOFOL N/A 09/16/2016   Procedure: COLONOSCOPY WITH PROPOFOL;  Surgeon: Lucilla Lame, MD;  Location: [facility];  Service: Endoscopy;  Laterality: N/A;   epidermal inclusion cyst excision of index finger Right 11/2015   FINGER MASS EXCISION Right 11/2015   2nd digit   POLYPECTOMY  09/16/2016   Procedure: POLYPECTOMY;  Surgeon: Lucilla Lame, MD;  Location: [facility];  Service: Endoscopy;;   REPLACEMENT TOTAL KNEE  August 2012   Right , [place]    Prior to Admission medications   Medication Sig Start Date End Date Taking? Authorizing Provider  Avocado Oil OIL by Does not apply route.   Yes [provider]  Cascara Sagrada 450 MG CAPS Take by mouth. For digestion   Yes [provider]  Cholecalciferol (VITAMIN D3 PO) Take 1 tablet by mouth daily.   Yes [provider]  Cyanocobalamin (VITAMIN B-12 PO) Take by mouth daily.   Yes [provider]  fluticasone (FLONASE) 50 MCG/ACT nasal spray USE 2 SPRAYS INTO  EACH NOSTRIL ONCE DAILY 12/03/18  Yes Glean Hess, MD  hydrochlorothiazide (HYDRODIURIL) 25 MG tablet TAKE 1 TABLET BY MOUTH ONCE DAILY 04/21/21  Yes Glean Hess, MD  Multiple Vitamins-Minerals (CENTRUM PO) Take by mouth.   Yes [provider]  PARoxetine (PAXIL) 20 MG tablet TAKE 1 TABLET BY MOUTH ONCE DAILY 03/17/21  Yes Glean Hess, MD  Vitamin D, Ergocalciferol, (DRISDOL) 1.25 MG (50000 UNIT) CAPS capsule TAKE 1 CAPSULE BY MOUTH SAME DAY EVERY WEEK 04/21/21  Yes Glean Hess, MD  Acetaminophen (TYLENOL ARTHRITIS PAIN PO) Take 600 mg by mouth as needed.    [provider]  losartan (COZAAR) 25 MG tablet TAKE 1 TABLET BY MOUTH ONCE DAILY Patient not taking: Reported on 10/11/2021 03/02/21   Glean Hess, MD    Allergies as of 09/27/2021 - Review Complete 09/27/2021  Allergen Reaction Noted   Oxycodone Nausea Only 04/14/2011   Statins Other (See Comments) 12/03/2012    Family History  Problem Relation Age of Onset   Diabetes Mother    Heart disease Father    Breast cancer Maternal Aunt 68   Breast cancer Cousin 39       maternal   Breast cancer Maternal Aunt 69   Breast cancer Cousin        maternal   Cancer Neg Hx  breast, ovarian, colon    Social History   Socioeconomic History   Marital status: Married    Spouse name: Not on file   Number of children: Not on file   Years of education: Not on file   Highest education level: Not on file  Occupational History   Not on file  Tobacco Use   Smoking status: Never   Smokeless tobacco: Never  Vaping Use   Vaping Use: Never used  Substance and Sexual Activity   Alcohol use: No   Drug use: No   Sexual activity: Not on file  Other Topics Concern   Not on file  Social History Narrative   Married.   Social Determinants of Health   Financial Resource Strain: Unknown (08/14/2017)   Overall Financial Resource Strain (CARDIA)    Difficulty of Paying Living Expenses: Patient  refused  Food Insecurity: Unknown (08/14/2017)   Hunger Vital Sign    Worried About Running Out of Food in the Last Year: Patient refused    [place] in the Last Year: Patient refused  Transportation Needs: Unknown (08/14/2017)   [place] - Hydrologist (Medical): Patient refused    Lack of Transportation (Non-Medical): Patient refused  Physical Activity: Insufficiently Active (08/14/2017)   Exercise Vital Sign    Days of Exercise per Week: 3 days    Minutes of Exercise per Session: 30 min  Stress: No Stress Concern Present (08/14/2017)   [place] of Stress : Not at all  Social Connections: Unknown (08/14/2017)   Social Connection and Isolation Panel [NHANES]    Frequency of Communication with Friends and Family: Patient refused    Frequency of Social Gatherings with Friends and Family: Patient refused    Attends Religious Services: Patient refused    Active Member of Clubs or Organizations: Patient refused    Attends Archivist Meetings: Patient refused    Marital Status: Patient refused  Intimate Partner Violence: Unknown (08/14/2017)   Humiliation, Afraid, Rape, and Kick questionnaire    Fear of Current or Ex-Partner: Patient refused    Emotionally Abused: Patient refused    Physically Abused: Patient refused    Sexually Abused: Patient refused    Review of Systems: See HPI, otherwise negative ROS  Physical Exam: BP (!) 165/96   Pulse 72   Temp 97.7 F (36.5 C) (Temporal)   Resp 20   Ht 5' 3.5" (1.613 m)   Wt 94.3 kg   SpO2 98%   BMI 36.27 kg/m  General:   Alert,  pleasant and cooperative in NAD Head:  Normocephalic and atraumatic. Neck:  Supple; no masses or thyromegaly. Lungs:  Clear throughout to auscultation.    Heart:  Regular rate and rhythm. Abdomen:  Soft, nontender and nondistended. Normal bowel sounds, without guarding, and without  rebound.   Neurologic:  Alert and  oriented x4;  grossly normal neurologically.  Impression/Plan: Joanne Benton is here for an colonoscopy to be performed for a history of adenomatous polyps on 2018   Risks, benefits, limitations, and alternatives regarding  colonoscopy have been reviewed with the patient.  Questions have been answered.  All parties agreeable.   Lucilla Lame, MD  10/25/2021, 8:30 AM

## 2021-10-26 ENCOUNTER — Telehealth: Payer: Self-pay | Admitting: Gastroenterology

## 2021-10-26 ENCOUNTER — Encounter: Payer: Self-pay | Admitting: Gastroenterology

## 2021-10-26 ENCOUNTER — Other Ambulatory Visit: Payer: Self-pay

## 2021-10-26 DIAGNOSIS — Z86010 Personal history of colonic polyps: Secondary | ICD-10-CM

## 2021-10-26 MED ORDER — CLENPIQ 10-3.5-12 MG-GM -GM/160ML PO SOLN: 1.0000 | Freq: Once | ORAL | 0 refills | Status: AC

## 2021-10-26 NOTE — Telephone Encounter (Signed)
Patient left vm stating that she had a colonoscopy with Dr Allen Norris yesterday but he could not complete it because she was not cleaned out. Patient is requesting a call back to reschedule and is also asking questions about insurance and prior authorization.

## 2021-11-09 ENCOUNTER — Telehealth: Payer: Self-pay | Admitting: Gastroenterology

## 2021-11-09 ENCOUNTER — Other Ambulatory Visit: Payer: Self-pay

## 2021-11-09 NOTE — Telephone Encounter (Signed)
Pharmacy is requesting the correct prescription be sent in. States the prescription sent does not make any sense.

## 2021-11-09 NOTE — Progress Notes (Signed)
ClenPiq prescription clarification made.

## 2021-11-10 ENCOUNTER — Encounter: Payer: Self-pay | Admitting: Gastroenterology

## 2021-11-16 DIAGNOSIS — T466X5A Adverse effect of antihyperlipidemic and antiarteriosclerotic drugs, initial encounter: Secondary | ICD-10-CM | POA: Diagnosis not present

## 2021-11-16 DIAGNOSIS — F411 Generalized anxiety disorder: Secondary | ICD-10-CM | POA: Diagnosis not present

## 2021-11-16 DIAGNOSIS — M791 Myalgia, unspecified site: Secondary | ICD-10-CM | POA: Diagnosis not present

## 2021-11-16 DIAGNOSIS — I10 Essential (primary) hypertension: Secondary | ICD-10-CM | POA: Diagnosis not present

## 2021-11-16 DIAGNOSIS — R0602 Shortness of breath: Secondary | ICD-10-CM | POA: Diagnosis not present

## 2021-11-16 DIAGNOSIS — E785 Hyperlipidemia, unspecified: Secondary | ICD-10-CM | POA: Diagnosis not present

## 2021-11-18 ENCOUNTER — Encounter: Payer: Self-pay | Admitting: Gastroenterology

## 2021-11-18 ENCOUNTER — Other Ambulatory Visit: Payer: Self-pay

## 2021-11-18 ENCOUNTER — Ambulatory Visit (AMBULATORY_SURGERY_CENTER): Payer: Medicare PPO | Admitting: Anesthesiology

## 2021-11-18 ENCOUNTER — Encounter
Admission: RE | Disposition: A | Discharge status: Home / Self Care | Payer: Self-pay | Source: Home / Self Care | Attending: Gastroenterology

## 2021-11-18 ENCOUNTER — Ambulatory Visit: Payer: Medicare PPO | Admitting: Anesthesiology

## 2021-11-18 ENCOUNTER — Ambulatory Visit
Admission: RE | Admit: 2021-11-18 | Discharge: 2021-11-18 | Disposition: A | Discharge status: Home / Self Care | Payer: Medicare PPO | Attending: Gastroenterology | Admitting: Gastroenterology

## 2021-11-18 DIAGNOSIS — D124 Benign neoplasm of descending colon: Secondary | ICD-10-CM | POA: Diagnosis not present

## 2021-11-18 DIAGNOSIS — Z1211 Encounter for screening for malignant neoplasm of colon: Secondary | ICD-10-CM | POA: Insufficient documentation

## 2021-11-18 DIAGNOSIS — Z860100 Personal history of colon polyps, unspecified: Secondary | ICD-10-CM

## 2021-11-18 DIAGNOSIS — K5730 Diverticulosis of large intestine without perforation or abscess without bleeding: Secondary | ICD-10-CM | POA: Diagnosis not present

## 2021-11-18 DIAGNOSIS — K640 First degree hemorrhoids: Secondary | ICD-10-CM | POA: Insufficient documentation

## 2021-11-18 DIAGNOSIS — K635 Polyp of colon: Secondary | ICD-10-CM | POA: Diagnosis not present

## 2021-11-18 DIAGNOSIS — D120 Benign neoplasm of cecum: Secondary | ICD-10-CM | POA: Insufficient documentation

## 2021-11-18 DIAGNOSIS — D123 Benign neoplasm of transverse colon: Secondary | ICD-10-CM | POA: Insufficient documentation

## 2021-11-18 DIAGNOSIS — D250 Submucous leiomyoma of uterus: Secondary | ICD-10-CM | POA: Insufficient documentation

## 2021-11-18 DIAGNOSIS — I10 Essential (primary) hypertension: Secondary | ICD-10-CM | POA: Insufficient documentation

## 2021-11-18 DIAGNOSIS — K6389 Other specified diseases of intestine: Secondary | ICD-10-CM | POA: Insufficient documentation

## 2021-11-18 HISTORY — PX: COLONOSCOPY WITH PROPOFOL: SHX5780

## 2021-11-18 SURGERY — COLONOSCOPY WITH PROPOFOL
Anesthesia: General

## 2021-11-18 MED ORDER — PROPOFOL 10 MG/ML IV BOLUS
INTRAVENOUS | Status: DC | PRN
  Administered 2021-11-18: 20 mg via INTRAVENOUS
  Administered 2021-11-18 (×5): 10 mg via INTRAVENOUS
  Administered 2021-11-18: 70 mg via INTRAVENOUS
  Administered 2021-11-18: 10 mg via INTRAVENOUS

## 2021-11-18 MED ORDER — SODIUM CHLORIDE 0.9 % IV SOLN: INTRAVENOUS | Status: DC

## 2021-11-18 MED ORDER — LIDOCAINE HCL (CARDIAC) PF 100 MG/5ML IV SOSY
PREFILLED_SYRINGE | INTRAVENOUS | Status: DC | PRN
  Administered 2021-11-18: 60 mg via INTRAVENOUS

## 2021-11-18 MED ORDER — LACTATED RINGERS IV SOLN: INTRAVENOUS | Status: DC

## 2021-11-18 SURGICAL SUPPLY — 21 items
CLIP HMST 235XBRD CATH ROT (MISCELLANEOUS) IMPLANT
CLIP RESOLUTION 360 11X235 (MISCELLANEOUS)
ELECT REM PT RETURN 9FT ADLT (ELECTROSURGICAL)
ELECTRODE REM PT RTRN 9FT ADLT (ELECTROSURGICAL) IMPLANT
FORCEPS BIOP RAD 4 LRG CAP 4 (CUTTING FORCEPS) ×1 IMPLANT
GOWN CVR UNV OPN BCK APRN NK (MISCELLANEOUS) ×2 IMPLANT
GOWN ISOL THUMB LOOP REG UNIV (MISCELLANEOUS) ×2
INJECTOR VARIJECT VIN23 (MISCELLANEOUS) IMPLANT
KIT DEFENDO VALVE AND CONN (KITS) IMPLANT
KIT PRC NS LF DISP ENDO (KITS) ×1 IMPLANT
KIT PROCEDURE OLYMPUS (KITS) ×1
MANIFOLD NEPTUNE II (INSTRUMENTS) ×2 IMPLANT
MARKER SPOT ENDO TATTOO 5ML (MISCELLANEOUS) IMPLANT
PROBE APC STR FIRE (PROBE) IMPLANT
RETRIEVER NET ROTH 2.5X230 LF (MISCELLANEOUS) IMPLANT
SNARE COLD EXACTO (MISCELLANEOUS) ×1 IMPLANT
SNARE SHORT THROW 13M SML OVAL (MISCELLANEOUS) IMPLANT
SNARE SNG USE RND 15MM (INSTRUMENTS) IMPLANT
TRAP ETRAP POLY (MISCELLANEOUS) ×1 IMPLANT
VARIJECT INJECTOR VIN23 (MISCELLANEOUS)
WATER STERILE IRR 250ML POUR (IV SOLUTION) ×2 IMPLANT

## 2021-11-18 NOTE — Transfer of Care (Signed)
Immediate Anesthesia Transfer of Care Note  Patient: Joanne Benton  Procedure(s) Performed: COLONOSCOPY WITH PROPOFOL  Patient Location: PACU  Anesthesia Type:General  Level of Consciousness: awake, alert  and oriented  Airway & Oxygen Therapy: Patient Spontanous Breathing  Post-op Assessment: Report given to RN and Post -op Vital signs reviewed and stable  Post vital signs: Reviewed and stable  Last Vitals:  Vitals Value Taken Time  BP    Temp    Pulse 68 11/18/21 0819  Resp    SpO2 93 % 11/18/21 0819  Vitals shown include unvalidated device data.  Last Pain:  Vitals:   11/18/21 0702  PainSc: 0-No pain         Complications: No notable events documented.

## 2021-11-18 NOTE — Anesthesia Postprocedure Evaluation (Signed)
Anesthesia Post Note  Patient: Joanne Benton  Procedure(s) Performed: COLONOSCOPY WITH PROPOFOL     Patient location during evaluation: PACU Anesthesia Type: General Level of consciousness: awake and alert Pain management: pain level controlled Vital Signs Assessment: post-procedure vital signs reviewed and stable Respiratory status: spontaneous breathing, nonlabored ventilation, respiratory function stable and patient connected to nasal cannula oxygen Cardiovascular status: blood pressure returned to baseline and stable Postop Assessment: no apparent nausea or vomiting Anesthetic complications: no   No notable events documented.  Adele Barthel Aashrith Eves

## 2021-11-18 NOTE — Op Note (Signed)
[facility] Gastroenterology Patient Name: Detrice Cales Procedure Date: 11/18/2021 7:50 AM MRN: [id number] Account #: [id number] Date of Birth: May 19, 1945 Admit Type: Outpatient Age: 76 Room: [facility] OR ROOM 01 Gender: Female Note Status: Finalized Instrument Name: [id number] Procedure:             Colonoscopy Indications:           High risk colon cancer surveillance: Personal history                         of colonic polyps Providers:             Lucilla Lame MD, MD Referring MD:          Ocie Cornfield. Ouida Sills MD, MD (Referring MD) Medicines:             Propofol per Anesthesia Complications:         No immediate complications. Procedure:             Pre-Anesthesia Assessment:                        - Prior to the procedure, a History and Physical was                         performed, and patient medications and allergies were                         reviewed. The patient's tolerance of previous                         anesthesia was also reviewed. The risks and benefits                         of the procedure and the sedation options and risks                         were discussed with the patient. All questions were                         answered, and informed consent was obtained. Prior                         Anticoagulants: The patient has taken no previous                         anticoagulant or antiplatelet agents. ASA Grade                         Assessment: II - A patient with mild systemic disease.                         After reviewing the risks and benefits, the patient                         was deemed in satisfactory condition to undergo the                         procedure.  After obtaining informed consent, the colonoscope was                         passed under direct vision. Throughout the procedure,                         the patient's blood pressure, pulse, and oxygen                         saturations were monitored  continuously. The                         Colonoscope was introduced through the anus and                         advanced to the the cecum, identified by appendiceal                         orifice and ileocecal valve. The colonoscopy was                         performed without difficulty. The patient tolerated                         the procedure well. The quality of the bowel                         preparation was excellent. Findings:      The perianal and digital rectal examinations were normal.      Three sessile polyps were found in the transverse colon. The polyps were       4 to 6 mm in size. These polyps were removed with a cold snare.       Resection and retrieval were complete.      Two sessile polyps were found in the cecum. The polyps were 2 to 3 mm in       size. These polyps were removed with a cold biopsy forceps. Resection       and retrieval were complete.      Two sessile polyps were found in the descending colon. The polyps were 4       to 5 mm in size. These polyps were removed with a cold snare. Resection       and retrieval were complete.      Multiple small-mouthed diverticula were found in the entire colon.      Non-bleeding internal hemorrhoids were found during retroflexion. The       hemorrhoids were Grade I (internal hemorrhoids that do not prolapse). Impression:            - Three 4 to 6 mm polyps in the transverse colon,                         removed with a cold snare. Resected and retrieved.                        - Two 2 to 3 mm polyps in the cecum, removed with a                         cold biopsy  forceps. Resected and retrieved.                        - Two 4 to 5 mm polyps in the descending colon,                         removed with a cold snare. Resected and retrieved.                        - Diverticulosis in the entire examined colon.                        - Non-bleeding internal hemorrhoids. Recommendation:        - Discharge patient to  home.                        - Resume previous diet.                        - Continue present medications.                        - Await pathology results.                        - Repeat colonoscopy in 3 years for surveillance. Procedure Code(s):     --- Professional ---                        [phone], Colonoscopy, flexible; with removal of                         tumor(s), polyp(s), or other lesion(s) by snare                         technique                        45380, 40, Colonoscopy, flexible; with biopsy, single                         or multiple Diagnosis Code(s):     --- Professional ---                        Z86.010, Personal history of colonic polyps                        K63.5, Polyp of colon CPT copyright 2019 American Medical Association. All rights reserved. The codes documented in this report are preliminary and upon coder review may  be revised to meet current compliance requirements. Lucilla Lame MD, MD 11/18/2021 8:18:28 AM This report has been signed electronically. Number of Addenda: 0 Note Initiated On: 11/18/2021 7:50 AM Scope Withdrawal Time: 0 hours 12 minutes 1 second  Total Procedure Duration: 0 hours 19 minutes 3 seconds  Estimated Blood Loss:  Estimated blood loss: none.      [facility]

## 2021-11-18 NOTE — Anesthesia Procedure Notes (Signed)
Procedure Name: MAC Date/Time: 11/18/2021 8:00 AM  Performed by: Hedda Slade, CRNAPre-anesthesia Checklist: Patient identified, Emergency Drugs available, Suction available, Patient being monitored and Timeout performed Patient Re-evaluated:Patient Re-evaluated prior to induction Oxygen Delivery Method: Nasal cannula Induction Type: IV induction Placement Confirmation: positive ETCO2

## 2021-11-18 NOTE — Anesthesia Preprocedure Evaluation (Signed)
Anesthesia Evaluation  Patient identified by MRN, date of birth, ID band Patient awake    History of Anesthesia Complications (+) PONV and history of anesthetic complications  Airway Mallampati: III  TM Distance: >3 FB Neck ROM: Full    Dental no notable dental hx.    Pulmonary neg pulmonary ROS,    Pulmonary exam normal        Cardiovascular hypertension, Pt. on medications Normal cardiovascular exam     Neuro/Psych negative neurological ROS     GI/Hepatic negative GI ROS, Neg liver ROS,   Endo/Other  negative endocrine ROS  Renal/GU negative Renal ROS     Musculoskeletal   Abdominal   Peds  Hematology   Anesthesia Other Findings   Reproductive/Obstetrics                             Anesthesia Physical Anesthesia Plan  ASA: 2  Anesthesia Plan: General   Post-op Pain Management: Minimal or no pain anticipated   Induction: Intravenous  PONV Risk Score and Plan: Propofol infusion, TIVA and Treatment may vary due to age or medical condition  Airway Management Planned: Nasal Cannula and Natural Airway  Additional Equipment: None  Intra-op Plan:   Post-operative Plan:   Informed Consent: I have reviewed the patients History and Physical, chart, labs and discussed the procedure including the risks, benefits and alternatives for the proposed anesthesia with the patient or authorized representative who has indicated his/her understanding and acceptance.       Plan Discussed with: CRNA  Anesthesia Plan Comments:         Anesthesia Quick Evaluation

## 2021-11-18 NOTE — H&P (Signed)
Lucilla Lame, MD [address] [place], [place] [address] Phone:[phone] Fax : [phone]  Primary Care Physician:  Kirk Ruths, MD Primary Gastroenterologist:  Dr. Allen Norris  Pre-Procedure History & Physical: HPI:  Joanne Benton is a 76 y.o. female is here for an colonoscopy.   Past Medical History:  Diagnosis Date   Benign neoplasm of ascending colon    Benign neoplasm of descending colon    Benign neoplasm of transverse colon    Dyspnea    Fatigue    related to COVID   Hx of adenomatous colonic polyps 2008   Sankar, repeat due 2013   Hyperlipidemia    with statin intolerance   Hypertension    Knee pain    Motion sickness    back seat of cars   Obesity    Personal history of colonic polyps    PONV (postoperative nausea and vomiting)     Past Surgical History:  Procedure Laterality Date   ABDOMINAL HYSTERECTOMY  1978   secondary to IUD complicaiton    COLONOSCOPY     COLONOSCOPY WITH PROPOFOL N/A 09/16/2016   Procedure: COLONOSCOPY WITH PROPOFOL;  Surgeon: Lucilla Lame, MD;  Location: [facility];  Service: Endoscopy;  Laterality: N/A;   COLONOSCOPY WITH PROPOFOL N/A 10/25/2021   Procedure: COLONOSCOPY WITH PROPOFOL;  Surgeon: Lucilla Lame, MD;  Location: [facility];  Service: Endoscopy;  Laterality: N/A;   epidermal inclusion cyst excision of index finger Right 11/2015   FINGER MASS EXCISION Right 11/2015   2nd digit   POLYPECTOMY  09/16/2016   Procedure: POLYPECTOMY;  Surgeon: Lucilla Lame, MD;  Location: [facility];  Service: Endoscopy;;   REPLACEMENT TOTAL KNEE  August 2012   Right , [place]    Prior to Admission medications   Medication Sig Start Date End Date Taking? Authorizing Provider  Acetaminophen (TYLENOL ARTHRITIS PAIN PO) Take 600 mg by mouth as needed.   Yes [provider]  Avocado Oil OIL by Does not apply route.   Yes [provider]  Cascara Sagrada 450 MG CAPS Take by mouth. For  digestion   Yes [provider]  Cholecalciferol (VITAMIN D3 PO) Take 1 tablet by mouth daily.   Yes [provider]  Cyanocobalamin (VITAMIN B-12 PO) Take by mouth daily.   Yes [provider]  fluticasone (FLONASE) 50 MCG/ACT nasal spray USE 2 SPRAYS INTO EACH NOSTRIL ONCE DAILY 12/03/18  Yes Glean Hess, MD  hydrochlorothiazide (HYDRODIURIL) 25 MG tablet TAKE 1 TABLET BY MOUTH ONCE DAILY 04/21/21  Yes Glean Hess, MD  Multiple Vitamins-Minerals (CENTRUM PO) Take by mouth.   Yes [provider]  PARoxetine (PAXIL) 20 MG tablet TAKE 1 TABLET BY MOUTH ONCE DAILY 03/17/21  Yes Glean Hess, MD  Vitamin D, Ergocalciferol, (DRISDOL) 1.25 MG (50000 UNIT) CAPS capsule TAKE 1 CAPSULE BY MOUTH SAME DAY EVERY WEEK 04/21/21  Yes Glean Hess, MD  losartan (COZAAR) 25 MG tablet TAKE 1 TABLET BY MOUTH ONCE DAILY Patient not taking: Reported on 10/11/2021 03/02/21   Glean Hess, MD  Sod Picosulfate-Mag Ox-Cit Acd (CLENPIQ) 10-3.5-12 MG-GM -GM/160ML SOLN Take 1 Dose by mouth once for 84 doses. 10/26/21 01/18/22  Lucilla Lame, MD    Allergies as of 10/26/2021 - Review Complete 10/25/2021  Allergen Reaction Noted   Oxycodone Nausea Only 04/14/2011   Statins Other (See Comments) 12/03/2012    Family History  Problem Relation Age of Onset   Diabetes  Mother    Heart disease Father    Breast cancer Maternal Aunt 65   Breast cancer Cousin 16       maternal   Breast cancer Maternal Aunt 69   Breast cancer Cousin        maternal   Cancer Neg Hx        breast, ovarian, colon    Social History   Socioeconomic History   Marital status: Married    Spouse name: Not on file   Number of children: Not on file   Years of education: Not on file   Highest education level: Not on file  Occupational History   Not on file  Tobacco Use   Smoking status: Never   Smokeless tobacco: Never  Vaping Use   Vaping Use: Never used  Substance and Sexual  Activity   Alcohol use: No   Drug use: No   Sexual activity: Not on file  Other Topics Concern   Not on file  Social History Narrative   Married.   Social Determinants of Health   Financial Resource Strain: Unknown (08/14/2017)   Overall Financial Resource Strain (CARDIA)    Difficulty of Paying Living Expenses: Patient refused  Food Insecurity: Unknown (08/14/2017)   Hunger Vital Sign    Worried About Running Out of Food in the Last Year: Patient refused    [place] in the Last Year: Patient refused  Transportation Needs: Unknown (08/14/2017)   [place] - Hydrologist (Medical): Patient refused    Lack of Transportation (Non-Medical): Patient refused  Physical Activity: Insufficiently Active (08/14/2017)   Exercise Vital Sign    Days of Exercise per Week: 3 days    Minutes of Exercise per Session: 30 min  Stress: No Stress Concern Present (08/14/2017)   [place] of Stress : Not at all  Social Connections: Unknown (08/14/2017)   Social Connection and Isolation Panel [NHANES]    Frequency of Communication with Friends and Family: Patient refused    Frequency of Social Gatherings with Friends and Family: Patient refused    Attends Religious Services: Patient refused    Active Member of Clubs or Organizations: Patient refused    Attends Archivist Meetings: Patient refused    Marital Status: Patient refused  Intimate Partner Violence: Unknown (08/14/2017)   Humiliation, Afraid, Rape, and Kick questionnaire    Fear of Current or Ex-Partner: Patient refused    Emotionally Abused: Patient refused    Physically Abused: Patient refused    Sexually Abused: Patient refused    Review of Systems: See HPI, otherwise negative ROS  Physical Exam: BP (!) 149/88   Pulse 81   Temp 98.2 F (36.8 C)   Ht 5' 3.5" (1.613 m)   Wt 94.3 kg   SpO2 97%   BMI 36.27 kg/m   General:   Alert,  pleasant and cooperative in NAD Head:  Normocephalic and atraumatic. Neck:  Supple; no masses or thyromegaly. Lungs:  Clear throughout to auscultation.    Heart:  Regular rate and rhythm. Abdomen:  Soft, nontender and nondistended. Normal bowel sounds, without guarding, and without rebound.   Neurologic:  Alert and  oriented x4;  grossly normal neurologically.  Impression/Plan: BATOUL LIMES is here for an colonoscopy to be performed for a history of adenomatous polyps on 2018   Risks, benefits, limitations, and alternatives regarding  colonoscopy have been  reviewed with the patient.  Questions have been answered.  All parties agreeable.   Lucilla Lame, MD  11/18/2021, 7:14 AM

## 2021-11-19 ENCOUNTER — Encounter: Payer: Self-pay | Admitting: Gastroenterology

## 2021-11-22 ENCOUNTER — Encounter: Payer: Self-pay | Admitting: Gastroenterology

## 2021-11-22 DIAGNOSIS — M5417 Radiculopathy, lumbosacral region: Secondary | ICD-10-CM | POA: Diagnosis not present

## 2021-11-22 DIAGNOSIS — M5416 Radiculopathy, lumbar region: Secondary | ICD-10-CM | POA: Diagnosis not present

## 2021-11-22 DIAGNOSIS — M9905 Segmental and somatic dysfunction of pelvic region: Secondary | ICD-10-CM | POA: Diagnosis not present

## 2021-11-22 DIAGNOSIS — M9903 Segmental and somatic dysfunction of lumbar region: Secondary | ICD-10-CM | POA: Diagnosis not present

## 2021-11-22 LAB — SURGICAL PATHOLOGY

## 2021-12-08 DIAGNOSIS — I10 Essential (primary) hypertension: Secondary | ICD-10-CM | POA: Diagnosis not present

## 2021-12-14 ENCOUNTER — Encounter: Payer: Medicare PPO | Admitting: Internal Medicine

## 2021-12-15 DIAGNOSIS — N183 Chronic kidney disease, stage 3 unspecified: Secondary | ICD-10-CM | POA: Diagnosis not present

## 2021-12-15 DIAGNOSIS — I129 Hypertensive chronic kidney disease with stage 1 through stage 4 chronic kidney disease, or unspecified chronic kidney disease: Secondary | ICD-10-CM | POA: Diagnosis not present

## 2021-12-15 DIAGNOSIS — Z0000 Encounter for general adult medical examination without abnormal findings: Secondary | ICD-10-CM | POA: Diagnosis not present

## 2021-12-15 DIAGNOSIS — F411 Generalized anxiety disorder: Secondary | ICD-10-CM | POA: Diagnosis not present

## 2021-12-15 DIAGNOSIS — Z8739 Personal history of other diseases of the musculoskeletal system and connective tissue: Secondary | ICD-10-CM | POA: Diagnosis not present

## 2021-12-15 DIAGNOSIS — E785 Hyperlipidemia, unspecified: Secondary | ICD-10-CM | POA: Diagnosis not present

## 2021-12-23 DIAGNOSIS — Z96651 Presence of right artificial knee joint: Secondary | ICD-10-CM | POA: Diagnosis not present

## 2021-12-24 ENCOUNTER — Emergency Department
Admission: EM | Admit: 2021-12-24 | Discharge: 2021-12-24 | Disposition: A | Discharge status: Home / Self Care | Payer: Medicare PPO | Attending: Emergency Medicine | Admitting: Emergency Medicine

## 2021-12-24 ENCOUNTER — Other Ambulatory Visit: Payer: Self-pay

## 2021-12-24 ENCOUNTER — Emergency Department: Payer: Medicare PPO

## 2021-12-24 DIAGNOSIS — T50905A Adverse effect of unspecified drugs, medicaments and biological substances, initial encounter: Secondary | ICD-10-CM

## 2021-12-24 DIAGNOSIS — R42 Dizziness and giddiness: Secondary | ICD-10-CM | POA: Diagnosis not present

## 2021-12-24 DIAGNOSIS — R531 Weakness: Secondary | ICD-10-CM | POA: Diagnosis not present

## 2021-12-24 DIAGNOSIS — I10 Essential (primary) hypertension: Secondary | ICD-10-CM | POA: Diagnosis not present

## 2021-12-24 DIAGNOSIS — R0602 Shortness of breath: Secondary | ICD-10-CM | POA: Insufficient documentation

## 2021-12-24 DIAGNOSIS — T502X5A Adverse effect of carbonic-anhydrase inhibitors, benzothiadiazides and other diuretics, initial encounter: Secondary | ICD-10-CM | POA: Diagnosis not present

## 2021-12-24 LAB — BASIC METABOLIC PANEL
Anion gap: 11 (ref 5–15)
BUN: 11 mg/dL (ref 8–23)
CO2: 26 mmol/L (ref 22–32)
Calcium: 9.3 mg/dL (ref 8.9–10.3)
Chloride: 105 mmol/L (ref 98–111)
Creatinine, Ser: 0.88 mg/dL (ref 0.44–1.00)
GFR, Estimated: >60 mL/min (ref >60)
Glucose, Bld: 100 mg/dL — ABNORMAL HIGH (ref 70–99)
Potassium: 3.5 mmol/L (ref 3.5–5.1)
Sodium: 142 mmol/L (ref 135–145)

## 2021-12-24 LAB — CBC
HCT: 43.4 % (ref 36.0–46.0)
Hemoglobin: 14.6 g/dL (ref 12.0–15.0)
MCH: 32.7 pg (ref 26.0–34.0)
MCHC: 33.6 g/dL (ref 30.0–36.0)
MCV: 97.1 fL (ref 80.0–100.0)
Platelets: 232 10*3/uL (ref 150–400)
RBC: 4.47 MIL/uL (ref 3.87–5.11)
RDW: 12.7 % (ref 11.5–15.5)
WBC: 6.7 10*3/uL (ref 4.0–10.5)
nRBC: 0 % (ref 0.0–0.2)

## 2021-12-24 LAB — TROPONIN I (HIGH SENSITIVITY): Troponin I (High Sensitivity): 15 ng/L (ref <18)

## 2021-12-24 MED ORDER — HYDROCHLOROTHIAZIDE 25 MG PO TABS
25.0000 mg | ORAL_TABLET | Freq: Every day | ORAL | Status: DC
  Administered 2021-12-24: 25 mg via ORAL
  Filled 2021-12-24: qty 1

## 2021-12-24 MED ORDER — HYDROCHLOROTHIAZIDE 25 MG PO TABS: 25.0000 mg | ORAL_TABLET | Freq: Every day | ORAL | 1 refills | Status: AC

## 2021-12-24 MED ORDER — DIPHENHYDRAMINE HCL 50 MG/ML IJ SOLN
25.0000 mg | Freq: Once | INTRAMUSCULAR | Status: AC
  Administered 2021-12-24: 25 mg via INTRAMUSCULAR
  Filled 2021-12-24: qty 1

## 2021-12-24 NOTE — ED Provider Notes (Signed)
[facility], [place] Provider Note    Event Date/Time   First MD Initiated Contact with Patient 12/24/21 1504     (approximate)   History   Shortness of Breath   HPI  Joanne Benton is a 76 y.o. female with a history of hypertension who presents with multiple symptoms after starting Hyzaar last week.  The patient previously was on hydrochlorothiazide by itself and was started on Hyzaar in 8/30.  She states that over the course the week she started to feel somewhat weaker than normal and occasionally lightheaded.  The weakness caused her to fall once several days ago.  In addition over the last few days the patient developed itching, swelling around her eyes and ears, and some hives.  She also reports shortness of breath that started today.  She discontinued the medication yesterday.  She went to the walk-in clinic and was sent to the ED for further evaluation.   Physical Exam   Triage Vital Signs: ED Triage Vitals  Enc Vitals Group     BP 12/24/21 1208 (!) 184/106     Pulse Rate 12/24/21 1208 69     Resp 12/24/21 1208 (!) 24     Temp 12/24/21 1208 98.7 F (37.1 C)     Temp Source 12/24/21 1208 Oral     SpO2 12/24/21 1208 99 %     Weight 12/24/21 1216 208 lb (94.3 kg)     Height --      Head Circumference --      Peak Flow --      Pain Score 12/24/21 1216 8     Pain Loc --      Pain Edu? --      Excl. in [place]? --     Most recent vital signs: Vitals:   12/24/21 1208 12/24/21 1454  BP: (!) 184/106 (!) 162/95  Pulse: 69 76  Resp: (!) 24 19  Temp: 98.7 F (37.1 C)   SpO2: 99% 100%     General: Awake, no distress.  CV:  Good peripheral perfusion.  Resp:  Normal effort.  Lungs CTAB. Abd:  No distention.  Other:  Oropharynx clear with no erythema, exudate, pooled secretions, or stridor.  No visible facial, oral, or tongue swelling.   ED Results / Procedures / Treatments   Labs (all labs ordered are listed, but only abnormal results are  displayed) Labs Reviewed  BASIC METABOLIC PANEL - Abnormal; Notable for the following components:      Result Value   Glucose, Bld 100 (*)    All other components within normal limits  CBC  TROPONIN I (HIGH SENSITIVITY)     EKG  ED ECG REPORT I, Arta Silence, the attending physician, personally viewed and interpreted this ECG.  Date: 12/24/2021 EKG Time: 1218 Rate: 65 Rhythm: normal sinus rhythm QRS Axis: normal Intervals: normal ST/T Wave abnormalities: nonspecific ST abnormality Narrative Interpretation: no evidence of acute ischemia; no significant change when compared to EKG of 06/09/20   RADIOLOGY  Chest X-ray: I independently viewed and interpreted the images; there is no focal consolidation or edema  PROCEDURES:  Critical Care performed: No  Procedures   MEDICATIONS ORDERED IN ED: Medications  diphenhydrAMINE (BENADRYL) injection 25 mg (has no administration in time range)  hydrochlorothiazide (HYDRODIURIL) tablet 25 mg (has no administration in time range)     IMPRESSION / MDM / ASSESSMENT AND PLAN / ED COURSE  I reviewed the triage vital signs and the nursing notes.  76 year old female presents with allergic type symptoms after being started on combination losartan hydrochlorothiazide last week.  She self discontinued the medication yesterday and reports that the symptoms have improved over the last several hours.  I reviewed the past medical records.  The patient has no recent ED visits or admissions.  She has a noted history of dyspnea and fatigue but no prior CHF, other cardiac history, or COPD.  I confirmed that she was started on Hyzaar on 8/30 after a visit with her PMD Dr. Ouida Sills.    Exam is unremarkable with clear lungs, no visible facial or airway swelling, and normal vital signs except for mild hypertension.  Differential diagnosis includes, but is not limited to, allergic reaction, medication side effect.  Patient's presentation is  most consistent with acute presentation with potential threat to life or bodily function.  ED work-up is unremarkable.  The chest x-ray is clear.  BMP and CBC are normal.  Troponin is negative.  EKG shows no acute abnormalities.  The patient is on the cardiac monitor to evaluate for evidence of arrhythmia and/or significant heart rate changes.  Overall presentation is consistent with mild drug reaction likely related to the losartan which is apparently new for the patient.  At this time the patient appears well and is stable for discharge.  There is no evidence of life-threatening allergic reaction or other acute respiratory issue, and no indication for further emergent ED work-up.  She would like Benadryl due to the itching.  I will give a dose of the hydrochlorothiazide here since she did not take it today.  I represcribed the patient with her prior dose of hydrochlorothiazide.  I instructed her to discontinue the Hyzaar.  She should follow-up with her PMD Dr. Ouida Sills.  I gave the patient strict return precautions and she expressed understanding.   FINAL CLINICAL IMPRESSION(S) / ED DIAGNOSES   Final diagnoses:  Adverse effect of drug, initial encounter     Rx / DC Orders   ED Discharge Orders          Ordered    hydrochlorothiazide (HYDRODIURIL) 25 MG tablet  Daily        12/24/21 1539             Note:  This document was prepared using Dragon voice recognition software and may include unintentional dictation errors.    Arta Silence, MD 12/24/21 [phone]

## 2021-12-24 NOTE — Discharge Instructions (Signed)
Resume taking your hydrochlorothiazide that we have prescribed.  You should no longer take the Hyzaar until you discuss with Dr. Ouida Sills.  Return to the ER for new, worsening, or persistent severe itching, swelling, rashes or hives, shortness of breath, weakness or lightheadedness, or any other new or worsening symptoms that concern you.

## 2021-12-24 NOTE — ED Triage Notes (Signed)
Pt arrives with c/o SOB and dizziness after starting new BP med. Pt started taking losartan last week. Pt also c/o itching and swelling around her eye/ears. Pt thinks she is having an allergic reaction to meds.

## 2021-12-27 DIAGNOSIS — N183 Chronic kidney disease, stage 3 unspecified: Secondary | ICD-10-CM | POA: Diagnosis not present

## 2021-12-27 DIAGNOSIS — I129 Hypertensive chronic kidney disease with stage 1 through stage 4 chronic kidney disease, or unspecified chronic kidney disease: Secondary | ICD-10-CM | POA: Diagnosis not present

## 2021-12-27 DIAGNOSIS — T7840XD Allergy, unspecified, subsequent encounter: Secondary | ICD-10-CM | POA: Diagnosis not present

## 2022-01-05 ENCOUNTER — Other Ambulatory Visit
Admission: RE | Admit: 2022-01-05 | Discharge: 2022-01-05 | Disposition: A | Discharge status: Home / Self Care | Payer: Medicare PPO | Source: Ambulatory Visit | Attending: Family Medicine | Admitting: Family Medicine

## 2022-01-05 DIAGNOSIS — J029 Acute pharyngitis, unspecified: Secondary | ICD-10-CM | POA: Diagnosis not present

## 2022-01-05 DIAGNOSIS — R0602 Shortness of breath: Secondary | ICD-10-CM | POA: Diagnosis not present

## 2022-01-05 DIAGNOSIS — Z03818 Encounter for observation for suspected exposure to other biological agents ruled out: Secondary | ICD-10-CM | POA: Diagnosis not present

## 2022-01-05 DIAGNOSIS — R051 Acute cough: Secondary | ICD-10-CM | POA: Insufficient documentation

## 2022-01-05 DIAGNOSIS — R059 Cough, unspecified: Secondary | ICD-10-CM | POA: Diagnosis not present

## 2022-01-05 LAB — D-DIMER, QUANTITATIVE: D-Dimer, Quant: 0.81 ug/mL-FEU — ABNORMAL HIGH (ref 0.00–0.50)

## 2022-01-05 LAB — BRAIN NATRIURETIC PEPTIDE: B Natriuretic Peptide: 63.7 pg/mL (ref 0.0–100.0)

## 2022-01-14 DIAGNOSIS — I10 Essential (primary) hypertension: Secondary | ICD-10-CM | POA: Diagnosis not present

## 2022-01-14 DIAGNOSIS — J40 Bronchitis, not specified as acute or chronic: Secondary | ICD-10-CM | POA: Diagnosis not present

## 2022-02-16 DIAGNOSIS — E860 Dehydration: Secondary | ICD-10-CM | POA: Diagnosis not present

## 2022-02-16 DIAGNOSIS — I10 Essential (primary) hypertension: Secondary | ICD-10-CM | POA: Diagnosis not present

## 2022-02-16 DIAGNOSIS — A09 Infectious gastroenteritis and colitis, unspecified: Secondary | ICD-10-CM | POA: Diagnosis not present

## 2022-04-22 ENCOUNTER — Other Ambulatory Visit: Payer: Self-pay | Admitting: Internal Medicine

## 2022-04-22 DIAGNOSIS — Z1231 Encounter for screening mammogram for malignant neoplasm of breast: Secondary | ICD-10-CM

## 2022-05-16 ENCOUNTER — Ambulatory Visit
Admission: RE | Admit: 2022-05-16 | Discharge: 2022-05-16 | Disposition: A | Discharge status: Home / Self Care | Payer: Medicare PPO | Source: Ambulatory Visit | Attending: Internal Medicine | Admitting: Internal Medicine

## 2022-05-16 DIAGNOSIS — Z1231 Encounter for screening mammogram for malignant neoplasm of breast: Secondary | ICD-10-CM | POA: Diagnosis not present

## 2022-12-14 ENCOUNTER — Other Ambulatory Visit: Payer: Self-pay | Admitting: Internal Medicine

## 2022-12-14 DIAGNOSIS — E785 Hyperlipidemia, unspecified: Secondary | ICD-10-CM

## 2022-12-14 DIAGNOSIS — R0602 Shortness of breath: Secondary | ICD-10-CM

## 2023-01-02 ENCOUNTER — Ambulatory Visit
Admission: RE | Admit: 2023-01-02 | Discharge: 2023-01-02 | Disposition: A | Discharge status: Home / Self Care | Payer: Medicare PPO | Source: Ambulatory Visit | Attending: Internal Medicine | Admitting: Internal Medicine

## 2023-01-02 DIAGNOSIS — E785 Hyperlipidemia, unspecified: Secondary | ICD-10-CM | POA: Insufficient documentation

## 2023-01-02 DIAGNOSIS — R0602 Shortness of breath: Secondary | ICD-10-CM | POA: Insufficient documentation

## 2023-01-12 ENCOUNTER — Other Ambulatory Visit: Payer: Self-pay | Admitting: Internal Medicine

## 2023-01-12 DIAGNOSIS — I2089 Other forms of angina pectoris: Secondary | ICD-10-CM

## 2023-01-12 DIAGNOSIS — R0602 Shortness of breath: Secondary | ICD-10-CM

## 2023-01-17 ENCOUNTER — Telehealth (HOSPITAL_COMMUNITY): Payer: Self-pay | Admitting: Emergency Medicine

## 2023-01-17 NOTE — Telephone Encounter (Signed)
Reaching out to patient to offer assistance regarding upcoming cardiac imaging study; pt verbalizes understanding of appt date/time, parking situation and where to check in, pre-test NPO status and medications ordered, and verified current allergies; name and call back number provided for further questions should they arise Cayne Yom RN Navigator Cardiac Imaging [facility] [phone] office [phone] cell 

## 2023-01-18 ENCOUNTER — Ambulatory Visit
Admission: RE | Admit: 2023-01-18 | Discharge: 2023-01-18 | Disposition: A | Discharge status: Home / Self Care | Payer: Medicare PPO | Source: Ambulatory Visit | Attending: Internal Medicine | Admitting: Internal Medicine

## 2023-01-18 DIAGNOSIS — I2089 Other forms of angina pectoris: Secondary | ICD-10-CM

## 2023-01-18 DIAGNOSIS — R0602 Shortness of breath: Secondary | ICD-10-CM | POA: Diagnosis present

## 2023-01-18 MED ORDER — NITROGLYCERIN 0.4 MG SL SUBL
0.8000 mg | SUBLINGUAL_TABLET | Freq: Once | SUBLINGUAL | Status: AC
  Administered 2023-01-18: 0.8 mg via SUBLINGUAL
  Filled 2023-01-18: qty 25

## 2023-01-18 MED ORDER — IOHEXOL 350 MG/ML SOLN
80.0000 mL | Freq: Once | INTRAVENOUS | Status: AC | PRN
  Administered 2023-01-18: 80 mL via INTRAVENOUS

## 2023-01-18 NOTE — Progress Notes (Signed)

## 2023-04-18 ENCOUNTER — Other Ambulatory Visit: Payer: Self-pay | Admitting: Internal Medicine

## 2023-04-18 DIAGNOSIS — Z1231 Encounter for screening mammogram for malignant neoplasm of breast: Secondary | ICD-10-CM

## 2023-05-18 ENCOUNTER — Ambulatory Visit
Admission: RE | Admit: 2023-05-18 | Discharge: 2023-05-18 | Disposition: A | Discharge status: Home / Self Care | Payer: Medicare PPO | Source: Ambulatory Visit | Attending: Internal Medicine | Admitting: Internal Medicine

## 2023-05-18 DIAGNOSIS — Z1231 Encounter for screening mammogram for malignant neoplasm of breast: Secondary | ICD-10-CM | POA: Insufficient documentation

## 2024-04-22 ENCOUNTER — Other Ambulatory Visit: Payer: Self-pay | Admitting: Internal Medicine

## 2024-04-22 DIAGNOSIS — Z1231 Encounter for screening mammogram for malignant neoplasm of breast: Secondary | ICD-10-CM

## 2024-05-22 ENCOUNTER — Ambulatory Visit
Admission: RE | Admit: 2024-05-22 | Discharge: 2024-05-22 | Disposition: A | Source: Ambulatory Visit | Attending: Internal Medicine | Admitting: Internal Medicine

## 2024-05-22 DIAGNOSIS — Z1231 Encounter for screening mammogram for malignant neoplasm of breast: Secondary | ICD-10-CM
